# Patient Record
Sex: Female | Born: 1945 | Race: White | Hispanic: No | Marital: Single | Smoking: Former smoker
Health system: Southern US, Community
[De-identification: ages and names within clinical notes are randomized; demographics above are authoritative.]

## PROBLEM LIST (undated history)

## (undated) DIAGNOSIS — I10 Essential (primary) hypertension: Secondary | ICD-10-CM

## (undated) DIAGNOSIS — C519 Malignant neoplasm of vulva, unspecified: Secondary | ICD-10-CM

## (undated) HISTORY — PX: TUBAL LIGATION: SHX77

---

## 1998-01-26 ENCOUNTER — Emergency Department (HOSPITAL_COMMUNITY): Admission: EM | Admit: 1998-01-26 | Discharge: 1998-01-26 | Payer: Self-pay | Admitting: Emergency Medicine

## 2000-02-03 ENCOUNTER — Emergency Department (HOSPITAL_COMMUNITY): Admission: EM | Admit: 2000-02-03 | Discharge: 2000-02-03 | Payer: Self-pay | Admitting: Emergency Medicine

## 2000-07-16 ENCOUNTER — Emergency Department (HOSPITAL_COMMUNITY): Admission: EM | Admit: 2000-07-16 | Discharge: 2000-07-16 | Payer: Self-pay | Admitting: Emergency Medicine

## 2011-06-05 ENCOUNTER — Encounter: Payer: Self-pay | Admitting: Internal Medicine

## 2011-06-26 ENCOUNTER — Telehealth: Payer: Self-pay

## 2011-06-26 NOTE — Telephone Encounter (Signed)
NOS PV.  Home number has been disconnected and work reported she has just left.

## 2011-07-03 NOTE — Telephone Encounter (Signed)
Advice only

## 2011-07-10 ENCOUNTER — Other Ambulatory Visit: Payer: Self-pay | Admitting: Internal Medicine

## 2011-09-02 ENCOUNTER — Ambulatory Visit (INDEPENDENT_AMBULATORY_CARE_PROVIDER_SITE_OTHER): Payer: Managed Care, Other (non HMO)

## 2011-09-02 DIAGNOSIS — J111 Influenza due to unidentified influenza virus with other respiratory manifestations: Secondary | ICD-10-CM

## 2011-09-02 DIAGNOSIS — R05 Cough: Secondary | ICD-10-CM

## 2011-09-02 DIAGNOSIS — R059 Cough, unspecified: Secondary | ICD-10-CM

## 2011-09-02 DIAGNOSIS — R509 Fever, unspecified: Secondary | ICD-10-CM

## 2013-09-30 ENCOUNTER — Ambulatory Visit (INDEPENDENT_AMBULATORY_CARE_PROVIDER_SITE_OTHER): Payer: Managed Care, Other (non HMO) | Admitting: Emergency Medicine

## 2013-09-30 VITALS — BP 112/62 | HR 75 | Temp 97.9°F | Resp 18 | Ht 61.5 in | Wt 117.0 lb

## 2013-09-30 DIAGNOSIS — A088 Other specified intestinal infections: Secondary | ICD-10-CM

## 2013-09-30 MED ORDER — LOPERAMIDE HCL 2 MG PO TABS: ORAL_TABLET | ORAL | Status: DC

## 2013-09-30 MED ORDER — ONDANSETRON 8 MG PO TBDP: 8.0000 mg | ORAL_TABLET | Freq: Three times a day (TID) | ORAL | Status: DC | PRN

## 2013-09-30 NOTE — Progress Notes (Signed)
[facility] [address] [place] [address] [phone]  Date:  09/30/2013   Name:  Carly Schwartz   DOB:  08-23-1946   MRN:  [id number]  PCP:  No PCP Per Patient    Chief Complaint: Emesis and Diarrhea   History of Present Illness:  Carly Schwartz is a 68 y.o. very pleasant female patient who presents with the following:  Ill since Friday with nausea and vomiting, water diarrhea.  The patient has no complaint of blood, mucous, or pus in her stools. No fever or chills.  Now drinking water and holding it down mostly.  No icterus, no rash.  Ill contacts.  No improvement with over the counter medications or other home remedies. Denies other complaint or health concern today.   There are no active problems to display for this patient.   History reviewed. No pertinent past medical history.  Past Surgical History  Procedure Laterality Date  . Tubal ligation      History  Substance Use Topics  . Smoking status: Current Every Day Smoker  . Smokeless tobacco: Not on file  . Alcohol Use: Not on file    History reviewed. No pertinent family history.  No Known Allergies  Medication list has been reviewed and updated.  Current Outpatient Prescriptions on File Prior to Visit  Medication Sig Dispense Refill  . lisinopril (PRINIVIL,ZESTRIL) 2.5 MG tablet       . zolpidem (AMBIEN) 5 MG tablet        No current facility-administered medications on file prior to visit.    Review of Systems:  As per HPI, otherwise negative.    Physical Examination: Filed Vitals:   09/30/13 1621  BP: 112/62  Pulse: 75  Temp: 97.9 F (36.6 C)  Resp: 18   Filed Vitals:   09/30/13 1621  Height: 5' 1.5" (1.562 m)  Weight: 117 lb (53.071 kg)   Body mass index is 21.75 kg/(m^2). Ideal Body Weight: Weight in (lb) to have BMI = 25: 134.2  GEN: WDWN, NAD, Non-toxic, A & O x 3 HEENT: Atraumatic, Normocephalic. Neck supple. No masses, No LAD. Ears and Nose: No  external deformity. CV: RRR, No M/G/R. No JVD. No thrill. No extra heart sounds. PULM: CTA B, no wheezes, crackles, rhonchi. No retractions. No resp. distress. No accessory muscle use. ABD: S, NT, ND, +BS. No rebound. No HSM. EXTR: No c/c/e NEURO Normal gait.  PSYCH: Normally interactive. Conversant. Not depressed or anxious appearing.  Calm demeanor.    Assessment and Plan: Viral gastroenteritis zofran Imodium Clears s Signed,  Ellison Carwin, MD

## 2013-09-30 NOTE — Patient Instructions (Signed)
Viral Gastroenteritis Viral gastroenteritis is also known as stomach flu. This condition affects the stomach and intestinal tract. It can cause sudden diarrhea and vomiting. The illness typically lasts 3 to 8 days. Most people develop an immune response that eventually gets rid of the virus. While this natural response develops, the virus can make you quite ill. CAUSES  Many different viruses can cause gastroenteritis, such as rotavirus or noroviruses. You can catch one of these viruses by consuming contaminated food or water. You may also catch a virus by sharing utensils or other personal items with an infected person or by touching a contaminated surface. SYMPTOMS  The most common symptoms are diarrhea and vomiting. These problems can cause a severe loss of body fluids (dehydration) and a body salt (electrolyte) imbalance. Other symptoms may include:  Fever.  Headache.  Fatigue.  Abdominal pain. DIAGNOSIS  Your caregiver can usually diagnose viral gastroenteritis based on your symptoms and a physical exam. A stool sample may also be taken to test for the presence of viruses or other infections. TREATMENT  This illness typically goes away on its own. Treatments are aimed at rehydration. The most serious cases of viral gastroenteritis involve vomiting so severely that you are not able to keep fluids down. In these cases, fluids must be given through an intravenous line (IV). HOME CARE INSTRUCTIONS   Drink enough fluids to keep your urine clear or pale yellow. Drink small amounts of fluids frequently and increase the amounts as tolerated.  Ask your caregiver for specific rehydration instructions.  Avoid:  Foods high in sugar.  Alcohol.  Carbonated drinks.  Tobacco.  Juice.  Caffeine drinks.  Extremely hot or cold fluids.  Fatty, greasy foods.  Too much intake of anything at one time.  Dairy products until 24 to 48 hours after diarrhea stops.  You may consume probiotics.  Probiotics are active cultures of beneficial bacteria. They may lessen the amount and number of diarrheal stools in adults. Probiotics can be found in yogurt with active cultures and in supplements.  Wash your hands well to avoid spreading the virus.  Only take over-the-counter or prescription medicines for pain, discomfort, or fever as directed by your caregiver. Do not give aspirin to children. Antidiarrheal medicines are not recommended.  Ask your caregiver if you should continue to take your regular prescribed and over-the-counter medicines.  Keep all follow-up appointments as directed by your caregiver. SEEK IMMEDIATE MEDICAL CARE IF:   You are unable to keep fluids down.  You do not urinate at least once every 6 to 8 hours.  You develop shortness of breath.  You notice blood in your stool or vomit. This may look like coffee grounds.  You have abdominal pain that increases or is concentrated in one small area (localized).  You have persistent vomiting or diarrhea.  You have a fever.  The patient is a child younger than 3 months, and he or she has a fever.  The patient is a child older than 3 months, and he or she has a fever and persistent symptoms.  The patient is a child older than 3 months, and he or she has a fever and symptoms suddenly get worse.  The patient is a baby, and he or she has no tears when crying. MAKE SURE YOU:   Understand these instructions.  Will watch your condition.  Will get help right away if you are not doing well or get worse. Document Released: 08/21/2005 Document Revised: 11/13/2011 Document Reviewed: 06/07/2011   ExitCare Patient Information 2014 [employer]. Diet The clear liquid diet consists of foods that are liquid or will become liquid at room temperature. Examples of foods allowed on a clear liquid diet include fruit juice, broth or bouillon, gelatin, or frozen ice pops. You should be able to see through the liquid. The purpose of  this diet is to provide the necessary fluids, electrolytes (such as sodium and potassium), and energy to keep the body functioning during times when you are not able to consume a regular diet. A clear liquid diet should not be continued for long periods of time, as it is not nutritionally adequate.  A CLEAR LIQUID DIET MAY BE NEEDED:  When a sudden-onset (acute) condition occurs before or after surgery.   As the first step in oral feeding.   For fluid and electrolyte replacement in diarrheal diseases.   As a diet before certain medical tests are performed.  ADEQUACY The clear liquid diet is adequate only in ascorbic acid, according to the Recommended Dietary Allowances of the National Research Council.  CHOOSING FOODS Breads and Starches  Allowed: None are allowed.   Avoid: All are to be avoided.  Vegetables  Allowed: Strained vegetable juices.   Avoid: Any others.  Fruit  Allowed: Strained fruit juices and fruit drinks. Include 1 serving of citrus or vitamin C-enriched fruit juice daily.   Avoid: Any others.  Meat and Meat Substitutes  Allowed: None are allowed.   Avoid: All are to be avoided.  Milk Products  Allowed: None are allowed.   Avoid: All are to be avoided.  Soups and Combination Foods  Allowed: Clear bouillon, broth, or strained broth-based soups.   Avoid: Any others.  Desserts and Sweets  Allowed: Sugar, honey. High-protein gelatin. Flavored gelatin, ices, or frozen ice pops that do not contain milk.   Avoid: Any others.  Fats and Oils  Allowed: None are allowed.   Avoid: All are to be avoided.  Beverages  Allowed: Cereal beverages, coffee (regular or decaffeinated), tea, or soda at the discretion of your health care provider.   Avoid: Any others.  Condiments  Allowed: Salt.   Avoid: Any others, including pepper.  Supplements  Allowed: Liquid nutrition beverages that you can see  through.   Avoid: Any others that contain lactose or fiber. SAMPLE MEAL PLAN Breakfast  4 oz (120 mL) strained orange juice.   to 1 cup (120 to 240 mL) gelatin (plain or fortified).  1 cup (240 mL) beverage (coffee or tea).  Sugar, if desired. Midmorning Snack   cup (120 mL) gelatin (plain or fortified). Lunch  1 cup (240 mL) broth or consomm.  4 oz (120 mL) strained grapefruit juice.   cup (120 mL) gelatin (plain or fortified).  1 cup (240 mL) beverage (coffee or tea).  Sugar, if desired. Midafternoon Snack   cup (120 mL) fruit ice.   cup (120 mL) strained fruit juice. Dinner  1 cup (240 mL) broth or consomm.   cup (120 mL) cranberry juice.   cup (120 mL) flavored gelatin (plain or fortified).  1 cup (240 mL) beverage (coffee or tea).  Sugar, if desired. Evening Snack  4 oz (120 mL) strained apple juice (vitamin C-fortified).   cup (120 mL) flavored gelatin (plain or fortified). MAKE SURE YOU:  Understand these instructions.  Will watch your child's condition.  Will get help right away if your child is not doing well or gets worse. Document Released: 08/21/2005 Document Revised: 04/23/2013 Document Reviewed: 01/21/2013   ExitCare Patient Information 2014 [employer], [employer].  

## 2013-09-30 NOTE — Addendum Note (Signed)
Addended by: Roselee Culver on: 09/30/2013 05:35 PM   Modules accepted: Orders

## 2015-03-31 ENCOUNTER — Encounter (HOSPITAL_COMMUNITY): Payer: Self-pay | Admitting: Emergency Medicine

## 2015-03-31 ENCOUNTER — Emergency Department (HOSPITAL_COMMUNITY): Payer: Medicare Other

## 2015-03-31 ENCOUNTER — Emergency Department (HOSPITAL_COMMUNITY)
Admission: EM | Admit: 2015-03-31 | Discharge: 2015-04-01 | Discharge status: Against Medical Advice | Payer: Medicare Other | Attending: Emergency Medicine | Admitting: Emergency Medicine

## 2015-03-31 DIAGNOSIS — R6883 Chills (without fever): Secondary | ICD-10-CM | POA: Diagnosis not present

## 2015-03-31 DIAGNOSIS — D72829 Elevated white blood cell count, unspecified: Secondary | ICD-10-CM | POA: Diagnosis not present

## 2015-03-31 DIAGNOSIS — R1110 Vomiting, unspecified: Secondary | ICD-10-CM | POA: Diagnosis not present

## 2015-03-31 DIAGNOSIS — R531 Weakness: Secondary | ICD-10-CM | POA: Insufficient documentation

## 2015-03-31 DIAGNOSIS — Z720 Tobacco use: Secondary | ICD-10-CM | POA: Insufficient documentation

## 2015-03-31 DIAGNOSIS — R05 Cough: Secondary | ICD-10-CM | POA: Insufficient documentation

## 2015-03-31 DIAGNOSIS — I10 Essential (primary) hypertension: Secondary | ICD-10-CM | POA: Diagnosis not present

## 2015-03-31 DIAGNOSIS — R197 Diarrhea, unspecified: Secondary | ICD-10-CM | POA: Insufficient documentation

## 2015-03-31 HISTORY — DX: Essential (primary) hypertension: I10

## 2015-03-31 LAB — COMPREHENSIVE METABOLIC PANEL
ALBUMIN: 3.1 g/dL — AB (ref 3.5–5.0)
ALT: 14 U/L (ref 14–54)
ANION GAP: 18 — AB (ref 5–15)
AST: 23 U/L (ref 15–41)
Alkaline Phosphatase: 77 U/L (ref 38–126)
BUN: 15 mg/dL (ref 6–20)
CHLORIDE: 92 mmol/L — AB (ref 101–111)
CO2: 21 mmol/L — ABNORMAL LOW (ref 22–32)
Calcium: 9.8 mg/dL (ref 8.9–10.3)
Creatinine, Ser: 1.45 mg/dL — ABNORMAL HIGH (ref 0.44–1.00)
GFR calc non Af Amer: 36 mL/min — ABNORMAL LOW (ref >60)
GFR, EST AFRICAN AMERICAN: 42 mL/min — AB (ref >60)
Glucose, Bld: 122 mg/dL — ABNORMAL HIGH (ref 65–99)
Potassium: 3.7 mmol/L (ref 3.5–5.1)
Sodium: 131 mmol/L — ABNORMAL LOW (ref 135–145)
Total Bilirubin: 1.1 mg/dL (ref 0.3–1.2)
Total Protein: 6.7 g/dL (ref 6.5–8.1)

## 2015-03-31 LAB — CBC
HCT: 44.6 % (ref 36.0–46.0)
Hemoglobin: 14.9 g/dL (ref 12.0–15.0)
MCH: 31 pg (ref 26.0–34.0)
MCHC: 33.4 g/dL (ref 30.0–36.0)
MCV: 92.9 fL (ref 78.0–100.0)
Platelets: 329 10*3/uL (ref 150–400)
RBC: 4.8 MIL/uL (ref 3.87–5.11)
RDW: 12.6 % (ref 11.5–15.5)
WBC: 22.2 10*3/uL — ABNORMAL HIGH (ref 4.0–10.5)

## 2015-03-31 MED ORDER — SODIUM CHLORIDE 0.9 % IV SOLN
1000.0000 mL | Freq: Once | INTRAVENOUS | Status: AC
  Administered 2015-03-31: 1000 mL via INTRAVENOUS

## 2015-03-31 MED ORDER — SODIUM CHLORIDE 0.9 % IV SOLN
1000.0000 mL | INTRAVENOUS | Status: DC
Start: 2015-03-31 — End: 2015-04-01

## 2015-03-31 MED ORDER — ONDANSETRON HCL 4 MG/2ML IJ SOLN
4.0000 mg | Freq: Once | INTRAMUSCULAR | Status: AC
  Administered 2015-03-31: 4 mg via INTRAVENOUS
  Filled 2015-03-31: qty 2

## 2015-03-31 MED ORDER — ONDANSETRON 8 MG PO TBDP: 8.0000 mg | ORAL_TABLET | Freq: Three times a day (TID) | ORAL | Status: DC | PRN

## 2015-03-31 MED ORDER — IOHEXOL 300 MG/ML  SOLN
25.0000 mL | INTRAMUSCULAR | Status: AC
  Administered 2015-03-31 (×2): 25 mL via ORAL

## 2015-03-31 NOTE — Discharge Instructions (Signed)
Emergency Department Resource Guide 1) Find a Doctor and Pay Out of Pocket Although you won't have to find out who is covered by your insurance plan, it is a good idea to ask around and get recommendations. You will then need to call the office and see if the doctor you have chosen will accept you as a new patient and what types of options they offer for patients who are self-pay. Some doctors offer discounts or will set up payment plans for their patients who do not have insurance, but you will need to ask so you aren't surprised when you get to your appointment.  2) Contact Your Local Health Department Not all health departments have doctors that can see patients for sick visits, but many do, so it is worth a call to see if yours does. If you don't know where your local health department is, you can check in your phone book. The CDC also has a tool to help you locate your state's health department, and many state websites also have listings of all of their local health departments.  3) Find a [facility] If your illness is not likely to be very severe or complicated, you may want to try a walk in clinic. These are popping up all over the country in pharmacies, drugstores, and shopping centers. They're usually staffed by nurse practitioners or physician assistants that have been trained to treat common illnesses and complaints. They're usually fairly quick and inexpensive. However, if you have serious medical issues or chronic medical problems, these are probably not your best option.   Chronic Pain Problems: Organization         Address     Phone             Notes  [facility]  [phone] Patients need to be referred by their primary care doctor.   Medication Assistance: Organization         Address     Phone             Notes  [facility] Medication [facility] [address] [place], [place] [address] [phone] --Must be a resident of  [facility] -- Must have NO insurance coverage whatsoever (no Medicaid/ Medicare, etc.) -- The pt. MUST have a primary care doctor that directs their care regularly and follows them in the community   [facility]  [phone]   [employer]  [phone]    Agencies that provide inexpensive medical care: Organization         Address     Phone             Notes  [place]  [phone]   [facility]    [phone]   [facility] [place], [place] [address] [phone]   [place] [address] [place] [phone]   Planned Parenthood    [phone]   [facility]    [phone]   [place] [address] [place] [address], [place] Phone:  [phone], Fax:  [phone] Hours of Operation:  9 am - 6 pm, M-F.  Also accepts Medicaid/Medicare and self-pay.  [facility] for [place], [address], [place] Phone: [phone], Fax: [phone]. Hours of Operation:  8:30 am - 5:30 pm, M-F.  Also accepts Medicaid and self-pay.  [facility] [address], [place] Phone: [phone]   [place], [place], [place] [phone], Ext. [phone] Mondays & Thursdays: 7-9 AM.  First 15 patients are seen on a first come, first serve basis.   [facility] [address], [place] [address] [phone] Accepts Medicaid   [place] Providers:  Organization         Address     Phone             Notes  [facility] [address], [place] [phone] Also accepts self-pay patients.  [facility] [address], [place]  [phone]   [place], [address], [place] [phone]   [facility]  [address] [place] [phone]   Lucianne Lei [address], [place]   [phone] Only accepts [place] patients after they have their name applied to their card.   Self-Pay (no insurance) in [facility]:  Organization         Address     Phone             Notes  Sickle Cell Patients, [facility] [place] [phone]   [facility] [place] [phone]   [facility] [place]  [place], [place], [place] [phone]   [facility]/Dr. Osei-Bonsu  [address], [place] or [address], [place] [phone] Phone number for both [place] and [place] locations is the same.  [facility] [address] [place] [phone]   [facility] [address], [place] or [address] [phone]   [facility] [address] [place] [phone], phone; [phone], fax Sees patients 1st and 3rd Saturday of every month.  Must not qualify for public or private insurance (i.e. Medicaid, Medicare,  Health Choice, Veterans' Benefits)  Household income should be no more than 200% of the poverty level The clinic cannot treat you if you are pregnant or think you are pregnant  Sexually transmitted diseases are not treated at the clinic.

## 2015-03-31 NOTE — ED Notes (Signed)
Tomi Bamberger, MD notified that the pt does not want her CT scan.

## 2015-03-31 NOTE — ED Notes (Signed)
Dr Tomi Bamberger is at Bedside.

## 2015-03-31 NOTE — ED Notes (Signed)
Pt's states that she does not want to have her CT scan.

## 2015-03-31 NOTE — ED Notes (Signed)
Pt states that she stopped having diarrhea two days ago, and has only vomited once in the last 24 hours. Pt reports pain only when she dry heaves.

## 2015-03-31 NOTE — ED Notes (Signed)
Patient transported to X-ray 

## 2015-03-31 NOTE — ED Provider Notes (Signed)
CSN: [id number]     Arrival date & time 03/31/15  1951 History   First MD Initiated Contact with Patient 03/31/15 2136     Chief Complaint  Patient presents with  . Emesis  . Diarrhea    Patient is a 69 y.o. female presenting with vomiting and diarrhea. The history is provided by the patient.  Emesis Severity:  Moderate Duration:  1 week Timing:  Constant Emesis appearance: dry heaves now. Able to tolerate:  Liquids Relieved by:  Nothing Exacerbated by: trying to eat. Associated symptoms: chills and diarrhea   Associated symptoms: no abdominal pain   Diarrhea Severity:  Moderate Onset quality:  Gradual Number of episodes:  None today Timing:  Intermittent Progression:  Resolved Associated symptoms: chills and vomiting   Associated symptoms: no abdominal pain and no fever     Past Medical History  Diagnosis Date  . Hypertension    Past Surgical History  Procedure Laterality Date  . Tubal ligation     No family history on file. History  Substance Use Topics  . Smoking status: Current Every Day Smoker  . Smokeless tobacco: Not on file  . Alcohol Use: No   OB History    No data available     Review of Systems  Constitutional: Positive for chills. Negative for fever.  Respiratory: Positive for cough.   Gastrointestinal: Positive for vomiting and diarrhea. Negative for abdominal pain.  Neurological: Positive for weakness.  All other systems reviewed and are negative.     Allergies  Review of patient's allergies indicates no known allergies.  Home Medications   Prior to Admission medications   Medication Sig Start Date End Date Taking? Authorizing Provider  lisinopril (PRINIVIL,ZESTRIL) 2.5 MG tablet  06/01/11   Historical Provider, MD  loperamide (IMODIUM A-D) 2 MG tablet 2 now and one hourly prn loose stool  Max 8 in 24 hours 09/30/13   Roselee Culver, MD  ondansetron (ZOFRAN-ODT) 8 MG disintegrating tablet Take 1 tablet (8 mg total) by mouth every 8  (eight) hours as needed for nausea. 03/31/15   Dorie Rank, MD  zolpidem (AMBIEN) 5 MG tablet  06/01/11   Historical Provider, MD   BP 127/72 mmHg  Pulse 109  Resp 12  Ht 5\' 2"  (1.575 m)  Wt 113 lb (51.256 kg)  BMI 20.66 kg/m2  SpO2 96% Physical Exam  Constitutional: She appears well-developed and well-nourished. No distress.  HENT:  Head: Normocephalic and atraumatic.  Right Ear: External ear normal.  Left Ear: External ear normal.  Eyes: Conjunctivae are normal. Right eye exhibits no discharge. Left eye exhibits no discharge. No scleral icterus.  Neck: Neck supple. No tracheal deviation present.  Cardiovascular: Normal rate, regular rhythm and intact distal pulses.   Pulmonary/Chest: Effort normal and breath sounds normal. No stridor. No respiratory distress. She has no wheezes. She has no rales.  Abdominal: Soft. Bowel sounds are normal. She exhibits no distension. There is no tenderness. There is no rebound and no guarding.  Musculoskeletal: She exhibits no edema or tenderness.  Neurological: She is alert. She has normal strength. No cranial nerve deficit (no facial droop, extraocular movements intact, no slurred speech) or sensory deficit. She exhibits normal muscle tone. She displays no seizure activity. Coordination normal.  Skin: Skin is warm and dry. No rash noted.  Psychiatric: She has a normal mood and affect.  Nursing note and vitals reviewed.   ED Course  Procedures (including critical care time) Labs Review Labs Reviewed  COMPREHENSIVE METABOLIC PANEL - Abnormal; Notable for the following:    Sodium 131 (*)    Chloride 92 (*)    CO2 21 (*)    Glucose, Bld 122 (*)    Creatinine, Ser 1.45 (*)    Albumin 3.1 (*)    GFR calc non Af Amer 36 (*)    GFR calc Af Amer 42 (*)    Anion gap 18 (*)    All other components within normal limits  CBC - Abnormal; Notable for the following:    WBC 22.2 (*)    All other components within normal limits  URINALYSIS, ROUTINE W  REFLEX MICROSCOPIC (NOT AT [facility])    Imaging Review Dg Chest 2 View  03/31/2015   CLINICAL DATA:  Nausea, diarrhea, leukocytosis.  EXAM: CHEST  2 VIEW  COMPARISON:  None  FINDINGS: There is a suggestion of upper lobe nodularity, right greater than left, superimposed on more generalized interstitial coarsening. There is probably also is moderate upper lobe emphysematous change. Hilar and mediastinal contours are unremarkable. There are no pleural effusions. Heart size is normal.  IMPRESSION: Question nodularity in the upper lobes, superimposed on emphysematous changes and interstitial coarsening. Consider chest CT for optimal characterization.   Electronically Signed   By: Andreas Newport M.D.   On: 03/31/2015 23:00      MDM   Final diagnoses:  Diarrhea  Elevated WBC count    Discussed doing a CT considering her elevated WBC count.  I explained to the patient this could be a sign of a serious infection that could be very dangerous.  Pt states she feels fine.  She does not want to wait for a CT scan.  SHe just wants to have something for nausea.  She understands she can return to the ED at any time if she changes her mind.  Discussed outpatient follow up with a PCP    Dorie Rank, MD 03/31/15 2332

## 2015-03-31 NOTE — ED Notes (Signed)
Pt. reports intermittent emesis and diarrhea onset last week with generalized weakness/fatigue . Denies fever , chills or body aches.

## 2015-04-01 ENCOUNTER — Ambulatory Visit (HOSPITAL_COMMUNITY): Payer: Medicare Other

## 2015-04-17 ENCOUNTER — Encounter (HOSPITAL_COMMUNITY): Payer: Self-pay | Admitting: Emergency Medicine

## 2015-04-17 DIAGNOSIS — A419 Sepsis, unspecified organism: Principal | ICD-10-CM | POA: Diagnosis present

## 2015-04-17 DIAGNOSIS — I10 Essential (primary) hypertension: Secondary | ICD-10-CM | POA: Diagnosis present

## 2015-04-17 DIAGNOSIS — R918 Other nonspecific abnormal finding of lung field: Secondary | ICD-10-CM | POA: Diagnosis present

## 2015-04-17 DIAGNOSIS — J90 Pleural effusion, not elsewhere classified: Secondary | ICD-10-CM | POA: Diagnosis present

## 2015-04-17 DIAGNOSIS — E162 Hypoglycemia, unspecified: Secondary | ICD-10-CM | POA: Diagnosis not present

## 2015-04-17 DIAGNOSIS — I951 Orthostatic hypotension: Secondary | ICD-10-CM | POA: Diagnosis present

## 2015-04-17 DIAGNOSIS — E860 Dehydration: Secondary | ICD-10-CM | POA: Diagnosis present

## 2015-04-17 DIAGNOSIS — E876 Hypokalemia: Secondary | ICD-10-CM | POA: Diagnosis not present

## 2015-04-17 DIAGNOSIS — N390 Urinary tract infection, site not specified: Secondary | ICD-10-CM | POA: Diagnosis present

## 2015-04-17 DIAGNOSIS — F17210 Nicotine dependence, cigarettes, uncomplicated: Secondary | ICD-10-CM | POA: Diagnosis present

## 2015-04-17 DIAGNOSIS — R6520 Severe sepsis without septic shock: Secondary | ICD-10-CM | POA: Diagnosis present

## 2015-04-17 DIAGNOSIS — E872 Acidosis: Secondary | ICD-10-CM | POA: Diagnosis present

## 2015-04-17 NOTE — ED Notes (Signed)
Patient here with complaint of 1 week of increasing dizziness, nausea, and weakness. Also reports that her right ear is painful and she feels pain near the glands of her neck on that side. Denies history of vertigo. Describes incidents of falling when she was suddenly overcome with dizziness. Denies emesis, but states dry heaves. Only complaining of mid abd pain secondary to attempting to vomit.

## 2015-04-18 ENCOUNTER — Emergency Department (HOSPITAL_COMMUNITY): Payer: Medicare Other

## 2015-04-18 ENCOUNTER — Encounter (HOSPITAL_COMMUNITY): Payer: Self-pay | Admitting: Internal Medicine

## 2015-04-18 ENCOUNTER — Inpatient Hospital Stay (HOSPITAL_COMMUNITY)
Admission: EM | Admit: 2015-04-18 | Discharge: 2015-04-20 | DRG: 872 | Disposition: A | Discharge status: Home / Self Care | Payer: Medicare Other | Attending: Student in an Organized Health Care Education/Training Program | Admitting: Student in an Organized Health Care Education/Training Program

## 2015-04-18 DIAGNOSIS — R6520 Severe sepsis without septic shock: Secondary | ICD-10-CM | POA: Diagnosis present

## 2015-04-18 DIAGNOSIS — R911 Solitary pulmonary nodule: Secondary | ICD-10-CM | POA: Diagnosis not present

## 2015-04-18 DIAGNOSIS — E876 Hypokalemia: Secondary | ICD-10-CM

## 2015-04-18 DIAGNOSIS — E872 Acidosis, unspecified: Secondary | ICD-10-CM | POA: Diagnosis present

## 2015-04-18 DIAGNOSIS — A419 Sepsis, unspecified organism: Secondary | ICD-10-CM | POA: Diagnosis present

## 2015-04-18 DIAGNOSIS — B9689 Other specified bacterial agents as the cause of diseases classified elsewhere: Secondary | ICD-10-CM | POA: Diagnosis not present

## 2015-04-18 DIAGNOSIS — N289 Disorder of kidney and ureter, unspecified: Secondary | ICD-10-CM

## 2015-04-18 DIAGNOSIS — E162 Hypoglycemia, unspecified: Secondary | ICD-10-CM | POA: Diagnosis not present

## 2015-04-18 DIAGNOSIS — R55 Syncope and collapse: Secondary | ICD-10-CM | POA: Diagnosis present

## 2015-04-18 DIAGNOSIS — I951 Orthostatic hypotension: Secondary | ICD-10-CM | POA: Diagnosis present

## 2015-04-18 DIAGNOSIS — J90 Pleural effusion, not elsewhere classified: Secondary | ICD-10-CM | POA: Diagnosis present

## 2015-04-18 DIAGNOSIS — E860 Dehydration: Secondary | ICD-10-CM

## 2015-04-18 DIAGNOSIS — I10 Essential (primary) hypertension: Secondary | ICD-10-CM | POA: Diagnosis present

## 2015-04-18 DIAGNOSIS — R7989 Other specified abnormal findings of blood chemistry: Secondary | ICD-10-CM

## 2015-04-18 DIAGNOSIS — F17210 Nicotine dependence, cigarettes, uncomplicated: Secondary | ICD-10-CM | POA: Diagnosis present

## 2015-04-18 DIAGNOSIS — N390 Urinary tract infection, site not specified: Secondary | ICD-10-CM | POA: Diagnosis present

## 2015-04-18 DIAGNOSIS — B3749 Other urogenital candidiasis: Secondary | ICD-10-CM | POA: Diagnosis present

## 2015-04-18 DIAGNOSIS — R918 Other nonspecific abnormal finding of lung field: Secondary | ICD-10-CM

## 2015-04-18 DIAGNOSIS — D72829 Elevated white blood cell count, unspecified: Secondary | ICD-10-CM

## 2015-04-18 DIAGNOSIS — R000 Tachycardia, unspecified: Secondary | ICD-10-CM

## 2015-04-18 DIAGNOSIS — R319 Hematuria, unspecified: Secondary | ICD-10-CM

## 2015-04-18 DIAGNOSIS — F17200 Nicotine dependence, unspecified, uncomplicated: Secondary | ICD-10-CM

## 2015-04-18 LAB — URINE MICROSCOPIC-ADD ON

## 2015-04-18 LAB — COMPREHENSIVE METABOLIC PANEL
ALBUMIN: 2.7 g/dL — AB (ref 3.5–5.0)
ALT: 9 U/L — AB (ref 14–54)
ANION GAP: 20 — AB (ref 5–15)
AST: 21 U/L (ref 15–41)
Alkaline Phosphatase: 75 U/L (ref 38–126)
BUN: 9 mg/dL (ref 6–20)
CHLORIDE: 99 mmol/L — AB (ref 101–111)
CO2: 18 mmol/L — ABNORMAL LOW (ref 22–32)
Calcium: 9.5 mg/dL (ref 8.9–10.3)
Creatinine, Ser: 1.18 mg/dL — ABNORMAL HIGH (ref 0.44–1.00)
GFR calc Af Amer: 53 mL/min — ABNORMAL LOW (ref >60)
GFR, EST NON AFRICAN AMERICAN: 46 mL/min — AB (ref >60)
GLUCOSE: 132 mg/dL — AB (ref 65–99)
POTASSIUM: 3.5 mmol/L (ref 3.5–5.1)
Sodium: 137 mmol/L (ref 135–145)
TOTAL PROTEIN: 5.8 g/dL — AB (ref 6.5–8.1)
Total Bilirubin: 1.2 mg/dL (ref 0.3–1.2)

## 2015-04-18 LAB — BASIC METABOLIC PANEL
ANION GAP: 11 (ref 5–15)
BUN: 8 mg/dL (ref 6–20)
CALCIUM: 7.8 mg/dL — AB (ref 8.9–10.3)
CO2: 19 mmol/L — AB (ref 22–32)
Chloride: 108 mmol/L (ref 101–111)
Creatinine, Ser: 0.91 mg/dL (ref 0.44–1.00)
Glucose, Bld: 77 mg/dL (ref 65–99)
POTASSIUM: 3.1 mmol/L — AB (ref 3.5–5.1)
SODIUM: 138 mmol/L (ref 135–145)

## 2015-04-18 LAB — URINALYSIS, ROUTINE W REFLEX MICROSCOPIC
Glucose, UA: NEGATIVE mg/dL
Glucose, UA: NEGATIVE mg/dL
Ketones, ur: >80 mg/dL — AB
Ketones, ur: 40 mg/dL — AB
NITRITE: POSITIVE — AB
NITRITE: POSITIVE — AB
PH: 6 (ref 5.0–8.0)
PROTEIN: 100 mg/dL — AB
PROTEIN: 30 mg/dL — AB
SPECIFIC GRAVITY, URINE: 1.018 (ref 1.005–1.030)
Specific Gravity, Urine: 1.02 (ref 1.005–1.030)
UROBILINOGEN UA: 1 mg/dL (ref 0.0–1.0)
UROBILINOGEN UA: 1 mg/dL (ref 0.0–1.0)
pH: 6 (ref 5.0–8.0)

## 2015-04-18 LAB — DIFFERENTIAL
Basophils Absolute: 0 10*3/uL (ref 0.0–0.1)
Basophils Relative: 0 % (ref 0–1)
EOS ABS: 0.1 10*3/uL (ref 0.0–0.7)
EOS PCT: 1 % (ref 0–5)
Lymphocytes Relative: 8 % — ABNORMAL LOW (ref 12–46)
Lymphs Abs: 1.3 10*3/uL (ref 0.7–4.0)
Monocytes Absolute: 1 10*3/uL (ref 0.1–1.0)
Monocytes Relative: 5 % (ref 3–12)
NEUTROS ABS: 15.3 10*3/uL — AB (ref 1.7–7.7)
NEUTROS PCT: 86 % — AB (ref 43–77)

## 2015-04-18 LAB — CBC
HCT: 40.9 % (ref 36.0–46.0)
HEMOGLOBIN: 13.8 g/dL (ref 12.0–15.0)
MCH: 31 pg (ref 26.0–34.0)
MCHC: 33.7 g/dL (ref 30.0–36.0)
MCV: 91.9 fL (ref 78.0–100.0)
Platelets: 289 10*3/uL (ref 150–400)
RBC: 4.45 MIL/uL (ref 3.87–5.11)
RDW: 13.8 % (ref 11.5–15.5)
WBC: 18 10*3/uL — ABNORMAL HIGH (ref 4.0–10.5)

## 2015-04-18 LAB — LIPASE, BLOOD: LIPASE: 16 U/L — AB (ref 22–51)

## 2015-04-18 LAB — I-STAT CG4 LACTIC ACID, ED
Lactic Acid, Venous: 6.21 mmol/L (ref 0.5–2.0)
Lactic Acid, Venous: 0.56 mmol/L (ref 0.5–2.0)

## 2015-04-18 MED ORDER — ONDANSETRON HCL 4 MG/2ML IJ SOLN
4.0000 mg | Freq: Once | INTRAMUSCULAR | Status: AC
  Administered 2015-04-18: 4 mg via INTRAVENOUS
  Filled 2015-04-18: qty 2

## 2015-04-18 MED ORDER — ENOXAPARIN SODIUM 40 MG/0.4ML ~~LOC~~ SOLN
40.0000 mg | SUBCUTANEOUS | Status: DC
  Administered 2015-04-18 – 2015-04-20 (×3): 40 mg via SUBCUTANEOUS
  Filled 2015-04-18 (×2): qty 0.4

## 2015-04-18 MED ORDER — ACETAMINOPHEN 650 MG RE SUPP: 650.0000 mg | Freq: Four times a day (QID) | RECTAL | Status: DC | PRN

## 2015-04-18 MED ORDER — SODIUM CHLORIDE 0.9 % IV SOLN
1000.0000 mL | Freq: Once | INTRAVENOUS | Status: AC
  Administered 2015-04-18: 1000 mL via INTRAVENOUS

## 2015-04-18 MED ORDER — ONDANSETRON HCL 4 MG PO TABS
4.0000 mg | ORAL_TABLET | Freq: Four times a day (QID) | ORAL | Status: DC | PRN
  Administered 2015-04-20: 4 mg via ORAL
  Filled 2015-04-18: qty 1

## 2015-04-18 MED ORDER — SODIUM CHLORIDE 0.9 % IV SOLN
1000.0000 mL | INTRAVENOUS | Status: DC
  Administered 2015-04-18 – 2015-04-20 (×6): 1000 mL via INTRAVENOUS

## 2015-04-18 MED ORDER — DEXTROSE 5 % IV SOLN
1.0000 g | Freq: Once | INTRAVENOUS | Status: AC
  Administered 2015-04-18: 1 g via INTRAVENOUS
  Filled 2015-04-18: qty 10

## 2015-04-18 MED ORDER — RAMELTEON 8 MG PO TABS
4.0000 mg | ORAL_TABLET | Freq: Once | ORAL | Status: AC
  Administered 2015-04-18: 4 mg via ORAL
  Filled 2015-04-18: qty 1

## 2015-04-18 MED ORDER — SODIUM CHLORIDE 0.9 % IV BOLUS (SEPSIS)
1000.0000 mL | Freq: Once | INTRAVENOUS | Status: AC
  Administered 2015-04-18: 1000 mL via INTRAVENOUS

## 2015-04-18 MED ORDER — ACETAMINOPHEN 325 MG PO TABS: 650.0000 mg | ORAL_TABLET | Freq: Four times a day (QID) | ORAL | Status: DC | PRN

## 2015-04-18 MED ORDER — SODIUM CHLORIDE 0.9 % IJ SOLN
3.0000 mL | Freq: Two times a day (BID) | INTRAMUSCULAR | Status: DC
  Administered 2015-04-18 – 2015-04-19 (×3): 3 mL via INTRAVENOUS

## 2015-04-18 MED ORDER — ONDANSETRON HCL 4 MG/2ML IJ SOLN
4.0000 mg | Freq: Four times a day (QID) | INTRAMUSCULAR | Status: DC | PRN
  Administered 2015-04-19: 4 mg via INTRAVENOUS
  Filled 2015-04-18: qty 2

## 2015-04-18 NOTE — H&P (Signed)
Date: 04/18/2015               Patient Name:  Carly Schwartz MRN: [id number]  DOB: 1946-01-23 Age / Sex: 69 y.o., female   PCP: No Pcp Per Patient         Medical Service: Internal Medicine Teaching Service         Attending Physician: Dr. Annia Belt, MD    First Contact: Dr. Zada Finders Pager: [phone]  Second Contact: Dr. Joni Reining Pager: [phone]       After Hours (After 5p/  First Contact Pager: [phone]  weekends / holidays): Second Contact Pager: [phone]   Chief Complaint: Dizziness  History of Present Illness: [name] is a 69 year old lady with PMH of HTN who presents with complaints of 1 week history of occasional dizziness/lightheadedness, nausea, and right ear pain. She states her dizziness occurs when she stands up from sitting or laying position and describes it more as a lightheaded feeling without a sensation that the room is spinning. She states that she has not vomited, but has had dry heaves. She does endorse a new non-productive cough that started yesterday. Of note, she came to the ED 2.5 weeks ago with symptoms of diarrhea and vomiting. Her WBC was 22 at the time and the ED provider counseled her on evaluation for serious infection, but she stated that she felt fine and only wanted medication for her nausea. Afterwards, her symptoms resolved until 1 week ago. She denies any dysuria, urgency, or frequency however UA in the ED shows WBCs/RBCs too numerous to count, positive nitrite, large leukocytes, and many squamous epithelial cells. Her lactic acid was elevated at 6.21, she was given 2L IV NS and lactic acid came down to 0.56. She was started on Ceftriaxone 1 gram IV in the ED. She denies any discharge or vaginal symptoms and states that she has not been sexually active for some time. She is post-menopausal and denies any bleeding.  She states that she has fallen twice in the last month or two, without any prodrome or loss of consciousness. She  denies any injury to herself other than some scrapes to her knee. Her blood pressure has been between 90-100s/50-60s. She does smoke 0.5 PPD since she was 69 years old and does endorse some recent SOB when climbing stairs.  Vitals on arrival to ED: BP 104/61, P-147, RR-18, Temp 97.4, SpO2 97% on room air.  Meds: Current Facility-Administered Medications  Medication Dose Route Frequency Provider Last Rate Last Dose  . 0.9 %  sodium chloride infusion  1,000 mL Intravenous Continuous Delora Fuel, MD 580 mL/hr at 04/18/15 0911 1,000 mL at 04/18/15 0911  . acetaminophen (TYLENOL) tablet 650 mg  650 mg Oral Q6H PRN Lucious Groves, DO       Or  . acetaminophen (TYLENOL) suppository 650 mg  650 mg Rectal Q6H PRN Lucious Groves, DO      . enoxaparin (LOVENOX) injection 40 mg  40 mg Subcutaneous Q24H Lucious Groves, DO      . ondansetron [facility]) tablet 4 mg  4 mg Oral Q6H PRN Lucious Groves, DO       Or  . ondansetron [facility]) injection 4 mg  4 mg Intravenous Q6H PRN Lucious Groves, DO      . sodium chloride 0.9 % injection 3 mL  3 mL Intravenous Q12H Lucious Groves, DO   3 mL at 04/18/15 1330  Allergies: Allergies as of 04/17/2015  . (No Known Allergies)   Past Medical History  Diagnosis Date  . Hypertension    Past Surgical History  Procedure Laterality Date  . Tubal ligation     History reviewed. No pertinent family history. Social History   Social History  . Marital Status: Single    Spouse Name: N/A  . Number of Children: N/A  . Years of Education: N/A   Occupational History  . Not on file.   Social History Main Topics  . Smoking status: Current Every Day Smoker  . Smokeless tobacco: Not on file  . Alcohol Use: No  . Drug Use: No  . Sexual Activity: Not on file   Other Topics Concern  . Not on file   Social History Narrative    Review of Systems: Review of Systems  Constitutional: Positive for chills. Negative for fever.  HENT: Positive for ear pain.   Eyes:  Negative for blurred vision and double vision.  Respiratory: Positive for cough. Negative for sputum production, shortness of breath and wheezing.   Cardiovascular: Negative for chest pain.  Gastrointestinal: Positive for nausea. Negative for vomiting, abdominal pain, diarrhea and constipation.  Genitourinary: Negative for dysuria, urgency, frequency and hematuria.       Denies discharge, no incontinence  Musculoskeletal: Positive for falls. Negative for myalgias and joint pain.  Skin: Negative for rash.  Neurological: Positive for dizziness and weakness. Negative for headaches.     Physical Exam: Blood pressure 109/71, pulse 112, temperature 98 F (36.7 C), temperature source Oral, resp. rate 18, height 5\' 2"  (1.575 m), weight 114 lb (51.71 kg), SpO2 96 %. Physical Exam  General: resting in bed, no acute distress HEENT: PERRL, EOMI, no scleral icterus, bilateral ears without drainage or obvious sign of infection, TMs normal bilaterally, oral mucosa moist without exudates or sign of abscess/infection, wears dentures Cardiac: regular rhythm, tachycardic, no rubs, murmurs or gallops Pulm: bibasilar crackles heard bilaterally Abd: soft, nontender, nondistended, BS present, no flank pain/tenderness Ext: warm and well perfused, no pedal edema, healing scabs on left knee Neuro: alert and oriented X3, normal mood and behavior   Lab results: Basic Metabolic Panel:  Recent Labs  04/17/15 2339 04/18/15 0630  NA 137 138  K 3.5 3.1*  CL 99* 108  CO2 18* 19*  GLUCOSE 132* 77  BUN 9 8  CREATININE 1.18* 0.91  CALCIUM 9.5 7.8*   Liver Function Tests:  Recent Labs  04/17/15 2339  AST 21  ALT 9*  ALKPHOS 75  BILITOT 1.2  PROT 5.8*  ALBUMIN 2.7*    Recent Labs  04/17/15 2339  LIPASE 16*   No results for input(s): AMMONIA in the last 72 hours. CBC:  Recent Labs  04/17/15 2339  WBC 18.0*  NEUTROABS 15.3*  HGB 13.8  HCT 40.9  MCV 91.9  PLT 289   Urinalysis:  Recent  Labs  04/18/15 0737  COLORURINE AMBER*  LABSPEC 1.020  PHURINE 6.0  GLUCOSEU NEGATIVE  HGBUR LARGE*  BILIRUBINUR SMALL*  KETONESUR >80*  PROTEINUR 100*  UROBILINOGEN 1.0  NITRITE POSITIVE*  LEUKOCYTESUR LARGE*    Imaging results:  Dg Chest Port 1 View  04/18/2015   CLINICAL DATA:  Leukocytosis.  EXAM: PORTABLE CHEST - 1 VIEW  COMPARISON:  March 31, 2015.  FINDINGS: The heart size and mediastinal contours are within normal limits. No pneumothorax or pleural effusion is noted. Small dense nodular densities are noted in right upper lobe most consistent with granulomata.  Rounded nodular density is noted in left upper lobe. The visualized skeletal structures are unremarkable.  IMPRESSION: Rounded nodular density seen in left upper lobe ; CT scan of the chest is recommended to evaluate for possible neoplasm.   Electronically Signed   By: Marijo Conception, M.D.   On: 04/18/2015 10:02    Other results: EKG: sinus tachycardia, nonspecific t-wave abnormality, there are no previous tracings available for comparison.  Assessment & Plan by Problem: Active Problems:   UTI (lower urinary tract infection)   Orthostatic hypotension   Lactic acidosis   Near syncope   Solitary pulmonary nodule  UTI (lower urinary tract infection): Although patient is asymptomatic, UA shows WBCs/RBCs too numerous to count, positive nitrite, large leukocytes, and many squamous epithelial cells. Possibly due to unclean catch. UTI in elderly can present atypically and this may be the case here. She denies discharge, but have to consider other infectious process such as PID. Blood cultures were not obtained as antibiotics were already started. She was given Rocephin 1 gram IV in the ED. WBC today is 18.0 compared to 22.2 two weeks ago. -CBC, BMP in the morning -Repeat UA w/ reflex microscopy -Urine culture pending    Solitary pulmonary nodule: Portable CXR was ordered due to possible concern for pneumonia due to  patient's history of SOB, leukocytosis, and recent non-productive cough. Bibasilar crackles are heard on physical exam. CXR results show small dense nodular densities in right upper lobe consistent with granulomata as well as a rounded nodular density in the left upper lobe. Patient has 0.5 PPD smoking history for 40+ years. Will need to follow up with repeat 2 view CXR or CT chest to evaluate for possible neoplasm. -BMP tomorrow morning for renal function -Will need to consider repeat 2 view CXR vs CT chest tomorrow  Orthostatic hypotension: Patients symptoms of lightheadedness when standing from sitting or lying position without other associated symptoms are suggestive of orthostatic hypotension. In the ED, orthostatics were checked and she did have significant drop in both systolic and diastolic pressures.  Lying BP - 93/54, pulse 118 Sitting BP - 78/62, pulse 126 Standing BP - 66/39, pulse 139 After fluid resuscitation, her orthostatics normalized. -Continue IV fluids NS 125 mL/hr  Lactic acidosis: Lactic acid of 6.21 resolved after 2 L IV NS at 0.56 at last check. Likely 2/2 to infectious process: -Continue IV fluids NS  Near syncope: Her near syncopal episodes are likely 2/2 orthostatic hypotension. She does not describe any pre-syncopal symptoms, no seizure-like activity or post-ictal symptoms, no loss of consciousness, and no vertigo. No heart murmur or irregular rhythm appreciated on cardiac exam. -Continue IV fluids NS  Diet: regular  DVT ppx: Lovenox 40 mg daily   Dispo: Disposition is deferred at this time, awaiting improvement of current medical problems. Anticipated discharge in approximately 1-3 day(s).   The patient does not have a current PCP (No Pcp Per Patient) and does not need an [facility] hospital follow-up appointment after discharge.  The patient does not have transportation limitations that hinder transportation to clinic appointments.  Signed: Zada Finders,  MD 04/18/2015, 2:35 PM

## 2015-04-18 NOTE — ED Provider Notes (Signed)
This is a 69 year old female signed out to me by Dr. Roxanne Mins who presents last night with nausea, vomiting, and weakness. She has had some GI syndrome for the past month that included nausea vomiting and diarrhea. The diarrhea has cleared and she felt better for some period of time. However, over the past week she has had ongoing nausea and vomiting. She has noted some episodes of chills and did not take her temperature. She denies any urinary tract infection symptoms such as increased frequency of urination, pain with urination, or decreased urine output. His sensation last night her initial heart rate was 148 and blood pressure was 104/61. She subsequently had an elevated lactic acid at 6.21. She was to 2 L of normal saline and lactic acid cleared. Source of infection appears to be urine with too numerous to count white blood cells and too numerous to count white blood cells, nitrite positive. She is receiving Rocephin IV here. She feels improved. Heart rate has decreased to 100 after 2 L normal saline. Last blood pressure was in the room approximately 10 minutes ago with systolic of 882. I discussed the patient's care with the teaching service as she is an unassigned medical patients. She'll be admitted to a telemetry bed.  Pattricia Boss, MD 04/18/15 [phone]

## 2015-04-18 NOTE — ED Notes (Signed)
MD at bedside. 

## 2015-04-18 NOTE — ED Provider Notes (Signed)
CSN: [id number]     Arrival date & time 04/17/15  2307 History  This chart was scribed for Delora Fuel, MD by Ludger Nutting, ED Scribe. This patient was seen in room A10C/A10C and the patient's care was started 2:27 AM.    Chief Complaint  Patient presents with  . Dizziness  . Fall   The history is provided by the patient. No language interpreter was used.     HPI Comments: Carly Schwartz is a 69 y.o. female who presents to the Emergency Department complaining of 1 week of intermittent, unchanged dizziness. She also complains of nausea, right ear pain, and generalized weakness. She states the dizziness occurs upon standing and is describes by room spinning and feeling drunk. She reports episodes of falls from the dizziness, states she repeatedly falls towards the left side. She reports having similar symptoms 3 weeks ago which resolved until 1 week ago. She denies fever, chills, diaphoresis.    Past Medical History  Diagnosis Date  . Hypertension    Past Surgical History  Procedure Laterality Date  . Tubal ligation     History reviewed. No pertinent family history. Social History  Substance Use Topics  . Smoking status: Current Every Day Smoker  . Smokeless tobacco: None  . Alcohol Use: No   OB History    No data available     Review of Systems  Constitutional: Negative for fever, chills and diaphoresis.  HENT: Positive for ear pain (right).   Gastrointestinal: Positive for nausea. Negative for vomiting.  Neurological: Positive for dizziness, weakness ( generalized) and light-headedness.  All other systems reviewed and are negative.     Allergies  Review of patient's allergies indicates no known allergies.  Home Medications   Prior to Admission medications   Medication Sig Start Date End Date Taking? Authorizing Provider  lisinopril (PRINIVIL,ZESTRIL) 2.5 MG tablet  06/01/11   Historical Provider, MD  loperamide (IMODIUM A-D) 2 MG tablet 2 now and one hourly prn loose  stool  Max 8 in 24 hours 09/30/13   Roselee Culver, MD  ondansetron (ZOFRAN-ODT) 8 MG disintegrating tablet Take 1 tablet (8 mg total) by mouth every 8 (eight) hours as needed for nausea. 03/31/15   Dorie Rank, MD  zolpidem (AMBIEN) 5 MG tablet  06/01/11   Historical Provider, MD   BP 104/61 mmHg  Pulse 147  Temp(Src) 97.4 F (36.3 C) (Oral)  Resp 18  Ht 5\' 2"  (1.575 m)  Wt 114 lb (51.71 kg)  BMI 20.85 kg/m2  SpO2 97% Physical Exam  Constitutional: She is oriented to person, place, and time. She appears well-developed and well-nourished.  Frail appearing   HENT:  Head: Normocephalic and atraumatic.  Cardiovascular: Regular rhythm and normal heart sounds.  Tachycardia present.   Pulmonary/Chest: Effort normal and breath sounds normal.  Abdominal: Soft. She exhibits no distension. Bowel sounds are decreased.  Bowel sounds are decreased   Neurological: She is alert and oriented to person, place, and time.  Normal finger to nose testing. Romberg test generally unsteady but does not fall.   Skin: Skin is warm and dry.  Psychiatric: She has a normal mood and affect.  Nursing note and vitals reviewed.   ED Course  Procedures (including critical care time)  DIAGNOSTIC STUDIES: Oxygen Saturation is 97% on RA, adequate by my interpretation.    COORDINATION OF CARE: 2:27 AM Discussed treatment plan with pt at bedside and pt agreed to plan.   Labs Review Results for orders  placed or performed during the hospital encounter of 04/18/15  Lipase, blood  Result Value Ref Range   Lipase 16 (L) 22 - 51 U/L  Comprehensive metabolic panel  Result Value Ref Range   Sodium 137 135 - 145 mmol/L   Potassium 3.5 3.5 - 5.1 mmol/L   Chloride 99 (L) 101 - 111 mmol/L   CO2 18 (L) 22 - 32 mmol/L   Glucose, Bld 132 (H) 65 - 99 mg/dL   BUN 9 6 - 20 mg/dL   Creatinine, Ser 1.18 (H) 0.44 - 1.00 mg/dL   Calcium 9.5 8.9 - 10.3 mg/dL   Total Protein 5.8 (L) 6.5 - 8.1 g/dL   Albumin 2.7 (L) 3.5 - 5.0  g/dL   AST 21 15 - 41 U/L   ALT 9 (L) 14 - 54 U/L   Alkaline Phosphatase 75 38 - 126 U/L   Total Bilirubin 1.2 0.3 - 1.2 mg/dL   GFR calc non Af Amer 46 (L) >60 mL/min   GFR calc Af Amer 53 (L) >60 mL/min   Anion gap 20 (H) 5 - 15  CBC  Result Value Ref Range   WBC 18.0 (H) 4.0 - 10.5 K/uL   RBC 4.45 3.87 - 5.11 MIL/uL   Hemoglobin 13.8 12.0 - 15.0 g/dL   HCT 40.9 36.0 - 46.0 %   MCV 91.9 78.0 - 100.0 fL   MCH 31.0 26.0 - 34.0 pg   MCHC 33.7 30.0 - 36.0 g/dL   RDW 13.8 11.5 - 15.5 %   Platelets 289 150 - 400 K/uL  Urinalysis, Routine w reflex microscopic (not at [facility])  Result Value Ref Range   Color, Urine AMBER (A) YELLOW   APPearance CLOUDY (A) CLEAR   Specific Gravity, Urine 1.020 1.005 - 1.030   pH 6.0 5.0 - 8.0   Glucose, UA NEGATIVE NEGATIVE mg/dL   Hgb urine dipstick LARGE (A) NEGATIVE   Bilirubin Urine SMALL (A) NEGATIVE   Ketones, ur >80 (A) NEGATIVE mg/dL   Protein, ur 100 (A) NEGATIVE mg/dL   Urobilinogen, UA 1.0 0.0 - 1.0 mg/dL   Nitrite POSITIVE (A) NEGATIVE   Leukocytes, UA LARGE (A) NEGATIVE  Differential  Result Value Ref Range   Neutrophils Relative % 86 (H) 43 - 77 %   Neutro Abs 15.3 (H) 1.7 - 7.7 K/uL   Lymphocytes Relative 8 (L) 12 - 46 %   Lymphs Abs 1.3 0.7 - 4.0 K/uL   Monocytes Relative 5 3 - 12 %   Monocytes Absolute 1.0 0.1 - 1.0 K/uL   Eosinophils Relative 1 0 - 5 %   Eosinophils Absolute 0.1 0.0 - 0.7 K/uL   Basophils Relative 0 0 - 1 %   Basophils Absolute 0.0 0.0 - 0.1 K/uL  Basic metabolic panel  Result Value Ref Range   Sodium 138 135 - 145 mmol/L   Potassium 3.1 (L) 3.5 - 5.1 mmol/L   Chloride 108 101 - 111 mmol/L   CO2 19 (L) 22 - 32 mmol/L   Glucose, Bld 77 65 - 99 mg/dL   BUN 8 6 - 20 mg/dL   Creatinine, Ser 0.91 0.44 - 1.00 mg/dL   Calcium 7.8 (L) 8.9 - 10.3 mg/dL   GFR calc non Af Amer >60 >60 mL/min   GFR calc Af Amer >60 >60 mL/min   Anion gap 11 5 - 15  Urine microscopic-add on  Result Value Ref Range   Squamous  Epithelial / LPF MANY (A) RARE  WBC, UA TOO NUMEROUS TO COUNT <3 WBC/hpf   RBC / HPF TOO NUMEROUS TO COUNT <3 RBC/hpf   Bacteria, UA MANY (A) RARE   Urine-Other MUCOUS PRESENT   Urinalysis, Routine w reflex microscopic (not at [facility])  Result Value Ref Range   Color, Urine AMBER (A) YELLOW   APPearance CLOUDY (A) CLEAR   Specific Gravity, Urine 1.018 1.005 - 1.030   pH 6.0 5.0 - 8.0   Glucose, UA NEGATIVE NEGATIVE mg/dL   Hgb urine dipstick LARGE (A) NEGATIVE   Bilirubin Urine SMALL (A) NEGATIVE   Ketones, ur 40 (A) NEGATIVE mg/dL   Protein, ur 30 (A) NEGATIVE mg/dL   Urobilinogen, UA 1.0 0.0 - 1.0 mg/dL   Nitrite POSITIVE (A) NEGATIVE   Leukocytes, UA MODERATE (A) NEGATIVE  Urine microscopic-add on  Result Value Ref Range   Squamous Epithelial / LPF FEW (A) RARE   WBC, UA TOO NUMEROUS TO COUNT <3 WBC/hpf   RBC / HPF 21-50 <3 RBC/hpf   Bacteria, UA FEW (A) RARE  I-Stat CG4 Lactic Acid, ED  Result Value Ref Range   Lactic Acid, Venous 6.21 (HH) 0.5 - 2.0 mmol/L   Comment NOTIFIED PHYSICIAN   I-Stat CG4 Lactic Acid, ED  Result Value Ref Range   Lactic Acid, Venous 0.56 0.5 - 2.0 mmol/L    Imaging Review Dg Chest Port 1 View  04/18/2015   CLINICAL DATA:  Leukocytosis.  EXAM: PORTABLE CHEST - 1 VIEW  COMPARISON:  March 31, 2015.  FINDINGS: The heart size and mediastinal contours are within normal limits. No pneumothorax or pleural effusion is noted. Small dense nodular densities are noted in right upper lobe most consistent with granulomata. Rounded nodular density is noted in left upper lobe. The visualized skeletal structures are unremarkable.  IMPRESSION: Rounded nodular density seen in left upper lobe ; CT scan of the chest is recommended to evaluate for possible neoplasm.   Electronically Signed   By: Marijo Conception, M.D.   On: 04/18/2015 94:76   I, Delora Fuel, MD, personally reviewed and evaluated these images and lab results as part of my medical decision-making.   EKG  Interpretation   Date/Time:  Saturday April 17 2015 23:50:25 EDT Ventricular Rate:  148 PR Interval:  112 QRS Duration: 80 QT Interval:  336 QTC Calculation: 527 R Axis:   72 Text Interpretation:  Sinus tachycardia Nonspecific ST abnormality  Abnormal ECG No old tracing to compare Confirmed by [facility]  MD, Zaida Reiland  ([id number]) on 04/18/2015 8:56:21 AM      CRITICAL CARE Performed by: [email or web address],KCLEX Total critical care time: 50 minutes Critical care time was exclusive of separately billable procedures and treating other patients. Critical care was necessary to treat or prevent imminent or life-threatening deterioration. Critical care was time spent personally by me on the following activities: development of treatment plan with patient and/or surrogate as well as nursing, discussions with consultants, evaluation of patient's response to treatment, examination of patient, obtaining history from patient or surrogate, ordering and performing treatments and interventions, ordering and review of laboratory studies, ordering and review of radiographic studies, pulse oximetry and re-evaluation of patient's condition.  MDM   Final diagnoses:  Urinary tract infection with hematuria, site unspecified  Sinus tachycardia  Elevated lactic acid level  Dehydration  Renal insufficiency  Hypokalemia    Patient with dizziness and tachycardia. Laboratory workup significant for leukocytosis and elevated lactic acid. She is afebrile and mentating normally. She is given IV fluids and lactic  acid level was repeated and has come back down to normal. Orthostatic vital signs were obtained showing significant drop in blood pressure so she clearly needed to be admitted. Urinalysis was obtained relatively late in her ED stay and did show evidence of urinary tract infection. At this point, that seems to be the etiology of her tachycardia and hypertension. She does not appear overtly septic and has responded well to fluids  alone. She started on Vicente Males biotics for urinary tract infection and admitted to the hospitalist service.   I personally performed the services described in this documentation, which was scribed in my presence. The recorded information has been reviewed and is accurate.    Delora Fuel, MD 83/25/49 [id number]

## 2015-04-18 NOTE — ED Notes (Signed)
Pt denies Nausea at this time.

## 2015-04-19 DIAGNOSIS — E872 Acidosis: Secondary | ICD-10-CM

## 2015-04-19 DIAGNOSIS — N390 Urinary tract infection, site not specified: Secondary | ICD-10-CM

## 2015-04-19 DIAGNOSIS — E876 Hypokalemia: Secondary | ICD-10-CM | POA: Diagnosis not present

## 2015-04-19 DIAGNOSIS — I10 Essential (primary) hypertension: Secondary | ICD-10-CM

## 2015-04-19 DIAGNOSIS — R55 Syncope and collapse: Secondary | ICD-10-CM

## 2015-04-19 DIAGNOSIS — I951 Orthostatic hypotension: Secondary | ICD-10-CM

## 2015-04-19 DIAGNOSIS — R911 Solitary pulmonary nodule: Secondary | ICD-10-CM

## 2015-04-19 DIAGNOSIS — B9689 Other specified bacterial agents as the cause of diseases classified elsewhere: Secondary | ICD-10-CM

## 2015-04-19 DIAGNOSIS — R7989 Other specified abnormal findings of blood chemistry: Secondary | ICD-10-CM | POA: Diagnosis present

## 2015-04-19 DIAGNOSIS — E860 Dehydration: Secondary | ICD-10-CM

## 2015-04-19 DIAGNOSIS — F17210 Nicotine dependence, cigarettes, uncomplicated: Secondary | ICD-10-CM

## 2015-04-19 LAB — GLUCOSE, CAPILLARY
GLUCOSE-CAPILLARY: 77 mg/dL (ref 65–99)
Glucose-Capillary: 74 mg/dL (ref 65–99)

## 2015-04-19 LAB — BASIC METABOLIC PANEL
Anion gap: 13 (ref 5–15)
CALCIUM: 7.7 mg/dL — AB (ref 8.9–10.3)
CO2: 17 mmol/L — ABNORMAL LOW (ref 22–32)
CREATININE: 0.77 mg/dL (ref 0.44–1.00)
Chloride: 108 mmol/L (ref 101–111)
GFR calc Af Amer: >60 mL/min (ref >60)
Glucose, Bld: 44 mg/dL — CL (ref 65–99)
POTASSIUM: 2.9 mmol/L — AB (ref 3.5–5.1)
SODIUM: 138 mmol/L (ref 135–145)

## 2015-04-19 LAB — URINE CULTURE

## 2015-04-19 LAB — CBC
HCT: 32.1 % — ABNORMAL LOW (ref 36.0–46.0)
Hemoglobin: 10.6 g/dL — ABNORMAL LOW (ref 12.0–15.0)
MCH: 31.4 pg (ref 26.0–34.0)
MCHC: 33 g/dL (ref 30.0–36.0)
MCV: 95 fL (ref 78.0–100.0)
PLATELETS: 197 10*3/uL (ref 150–400)
RBC: 3.38 MIL/uL — AB (ref 3.87–5.11)
RDW: 14.4 % (ref 11.5–15.5)
WBC: 10.3 10*3/uL (ref 4.0–10.5)

## 2015-04-19 MED ORDER — POTASSIUM CHLORIDE CRYS ER 20 MEQ PO TBCR
40.0000 meq | EXTENDED_RELEASE_TABLET | ORAL | Status: AC
  Administered 2015-04-19 – 2015-04-20 (×3): 40 meq via ORAL
  Filled 2015-04-19 (×3): qty 2

## 2015-04-19 MED ORDER — DEXTROSE 5 % IV SOLN
1.0000 g | INTRAVENOUS | Status: DC
  Administered 2015-04-19 – 2015-04-20 (×2): 1 g via INTRAVENOUS
  Filled 2015-04-19 (×2): qty 10

## 2015-04-19 NOTE — Progress Notes (Signed)
Subjective: Patient is feeling improved today. She denies dizziness, nausea, or urinary symptoms. No other changes overnight.  Objective: Vital signs in last 24 hours: Filed Vitals:   04/18/15 2034 04/19/15 0454 04/19/15 0744 04/19/15 1354  BP: 91/53 93/48 103/57 117/61  Pulse: 94 97 98 98  Temp: 98.7 F (37.1 C) 98.6 F (37 C) 98.5 F (36.9 C) 98.3 F (36.8 C)  TempSrc: Oral Oral Oral Oral  Resp: 17 16 17 17   Height:      Weight: 115 lb 8.3 oz (52.4 kg)     SpO2: 95% 92% 92% 99%   Weight change: 1 lb 8.3 oz (0.69 kg)  Intake/Output Summary (Last 24 hours) at 04/19/15 1521 Last data filed at 04/19/15 1000  Gross per 24 hour  Intake    840 ml  Output   1100 ml  Net   -260 ml   General: resting in bed, no acute distress HEENT: EOMI, no scleral icterus Cardiac: tachycardic, no rubs, murmurs or gallops Pulm: bibasilar crackles Abd: soft, nontender, nondistended, BS present Ext: warm and well perfused, no pedal edema Neuro: alert and oriented X3  Lab Results: Basic Metabolic Panel:  Recent Labs Lab 04/18/15 0630 04/19/15 0437  NA 138 138  K 3.1* 2.9*  CL 108 108  CO2 19* 17*  GLUCOSE 77 44*  BUN 8 <5*  CREATININE 0.91 0.77  CALCIUM 7.8* 7.7*   Liver Function Tests:  Recent Labs Lab 04/17/15 2339  AST 21  ALT 9*  ALKPHOS 75  BILITOT 1.2  PROT 5.8*  ALBUMIN 2.7*    Recent Labs Lab 04/17/15 2339  LIPASE 16*   No results for input(s): AMMONIA in the last 168 hours. CBC:  Recent Labs Lab 04/17/15 2339 04/19/15 0437  WBC 18.0* 10.3  NEUTROABS 15.3*  --   HGB 13.8 10.6*  HCT 40.9 32.1*  MCV 91.9 95.0  PLT 289 197   Cardiac Enzymes: No results for input(s): CKTOTAL, CKMB, CKMBINDEX, TROPONINI in the last 168 hours. BNP: No results for input(s): PROBNP in the last 168 hours. D-Dimer: No results for input(s): DDIMER in the last 168 hours. CBG:  Recent Labs Lab 04/19/15 0847  GLUCAP 77   Hemoglobin A1C: No results for input(s):  HGBA1C in the last 168 hours. Fasting Lipid Panel: No results for input(s): CHOL, HDL, LDLCALC, TRIG, CHOLHDL, LDLDIRECT in the last 168 hours. Thyroid Function Tests: No results for input(s): TSH, T4TOTAL, FREET4, T3FREE, THYROIDAB in the last 168 hours. Coagulation: No results for input(s): LABPROT, INR in the last 168 hours. Anemia Panel: No results for input(s): VITAMINB12, FOLATE, FERRITIN, TIBC, IRON, RETICCTPCT in the last 168 hours. Urine Drug Screen: Drugs of Abuse  No results found for: LABOPIA, COCAINSCRNUR, LABBENZ, AMPHETMU, THCU, LABBARB  Alcohol Level: No results for input(s): ETH in the last 168 hours. Urinalysis:  Recent Labs Lab 04/18/15 0737 04/18/15 1509  COLORURINE AMBER* AMBER*  LABSPEC 1.020 1.018  PHURINE 6.0 6.0  GLUCOSEU NEGATIVE NEGATIVE  HGBUR LARGE* LARGE*  BILIRUBINUR SMALL* SMALL*  KETONESUR >80* 40*  PROTEINUR 100* 30*  UROBILINOGEN 1.0 1.0  NITRITE POSITIVE* POSITIVE*  LEUKOCYTESUR LARGE* MODERATE*    Micro Results: Recent Results (from the past 240 hour(s))  Urine culture     Status: None   Collection Time: 04/18/15  7:35 AM  Result Value Ref Range Status   Specimen Description URINE, RANDOM  Final   Special Requests NONE  Final   Culture MULTIPLE SPECIES PRESENT, SUGGEST RECOLLECTION  Final  Report Status 04/19/2015 FINAL  Final   Studies/Results: Dg Chest Port 1 View  04/18/2015   CLINICAL DATA:  Leukocytosis.  EXAM: PORTABLE CHEST - 1 VIEW  COMPARISON:  March 31, 2015.  FINDINGS: The heart size and mediastinal contours are within normal limits. No pneumothorax or pleural effusion is noted. Small dense nodular densities are noted in right upper lobe most consistent with granulomata. Rounded nodular density is noted in left upper lobe. The visualized skeletal structures are unremarkable.  IMPRESSION: Rounded nodular density seen in left upper lobe ; CT scan of the chest is recommended to evaluate for possible neoplasm.   Electronically  Signed   By: Marijo Conception, M.D.   On: 04/18/2015 10:02   Medications: I have reviewed the patient's current medications. Scheduled Meds: . cefTRIAXone (ROCEPHIN)  IV  1 g Intravenous Q24H  . enoxaparin (LOVENOX) injection  40 mg Subcutaneous Q24H  . potassium chloride  40 mEq Oral Q4H  . sodium chloride  3 mL Intravenous Q12H   Continuous Infusions: . sodium chloride 1,000 mL (04/18/15 2315)   PRN Meds:.acetaminophen **OR** acetaminophen, ondansetron **OR** ondansetron (ZOFRAN) IV Assessment/Plan: Principal Problem:   UTI (lower urinary tract infection) Active Problems:   Orthostatic hypotension   Near syncope   Solitary pulmonary nodule   Dehydration   Elevated lactic acid level   Hypokalemia  UTI: UA show WBCs/RBCs too numerous to count, positive nitrite, large leukocytes. Patient continues without urinary symptoms. Serum WBC down to 10.3 compared to 18.0 on admission.  -Continue Ceftriaxone 1 gram IV -Urine culture shows multiple species present -Continue IV NS 125 mL/hr  Solitary pulmonary nodule: CXR shows rounded nodular density in left upper lobe. Patient has 0.5 PPD smoking history for 40+ years. -CT chest w/ contrast tomorrow  Hypokalemia: Potassium has dipped to 2.9 down from 3.1. -K-Dur 40 mEq q4h x 3 -BMP in the AM  Orthostatic hypotension: Likely 2/2 dehydration, resolved after fluid resuscitation in ED -Continue IV NS   Dispo: Disposition is deferred at this time, awaiting improvement of current medical problems.  Anticipated discharge in approximately 1-2 day(s).   The patient does not have a current PCP (No Pcp Per Patient) and does need an [facility] hospital follow-up appointment after discharge.  The patient does not have transportation limitations that hinder transportation to clinic appointments.  .Services Needed at time of discharge: Y = Yes, Blank = No PT:   OT:   RN:   Equipment:   Other:     LOS: 1 day   Zada Finders, MD 04/19/2015, 3:21  PM

## 2015-04-19 NOTE — Progress Notes (Signed)
Checked blood sugar-77.

## 2015-04-20 ENCOUNTER — Inpatient Hospital Stay (HOSPITAL_COMMUNITY): Payer: Medicare Other

## 2015-04-20 DIAGNOSIS — A419 Sepsis, unspecified organism: Secondary | ICD-10-CM | POA: Diagnosis present

## 2015-04-20 DIAGNOSIS — E162 Hypoglycemia, unspecified: Secondary | ICD-10-CM | POA: Diagnosis present

## 2015-04-20 DIAGNOSIS — R6520 Severe sepsis without septic shock: Secondary | ICD-10-CM

## 2015-04-20 LAB — BASIC METABOLIC PANEL
Anion gap: 11 (ref 5–15)
CALCIUM: 7.5 mg/dL — AB (ref 8.9–10.3)
CO2: 16 mmol/L — AB (ref 22–32)
CREATININE: 0.73 mg/dL (ref 0.44–1.00)
Chloride: 112 mmol/L — ABNORMAL HIGH (ref 101–111)
GFR calc non Af Amer: >60 mL/min (ref >60)
GLUCOSE: 57 mg/dL — AB (ref 65–99)
Potassium: 3.8 mmol/L (ref 3.5–5.1)
Sodium: 139 mmol/L (ref 135–145)

## 2015-04-20 MED ORDER — ONDANSETRON 8 MG PO TBDP: 8.0000 mg | ORAL_TABLET | Freq: Three times a day (TID) | ORAL | Status: DC | PRN

## 2015-04-20 MED ORDER — LOPERAMIDE HCL 2 MG PO TABS: ORAL_TABLET | ORAL | Status: DC

## 2015-04-20 MED ORDER — CIPROFLOXACIN HCL 500 MG PO TABS: 500.0000 mg | ORAL_TABLET | Freq: Two times a day (BID) | ORAL | Status: DC

## 2015-04-20 MED ORDER — IOHEXOL 300 MG/ML  SOLN
100.0000 mL | Freq: Once | INTRAMUSCULAR | Status: AC | PRN
  Administered 2015-04-20: 75 mL via INTRAVENOUS

## 2015-04-20 MED ORDER — CIPROFLOXACIN HCL 500 MG PO TABS: 500.0000 mg | ORAL_TABLET | Freq: Two times a day (BID) | ORAL | Status: AC

## 2015-04-20 NOTE — Clinical Documentation Improvement (Signed)
Would you please help clarify the medical condition related to clinical findings? Sepsis from UTI Severe Sepsis Severe sepsis with shock Other clinical explanation Unable to determine  Clinical Findings -  Pt admitted with GI symptoms.  Initial V/S were Pulse = 147, T = 97.4, BP 104/61; R = 18.  Lactic acid was 6.21 and had 3 L of fluid.  Lactic acid dropped to 0.56.  Also thought that Hypotension was orthostatic.  BP Lying - 93/54; Sitting - 78/62; Standing - 66/39.  MAP was as low as 63.  At 9am another set of orthostatic BP were taken - Lying - 93/75; Sitting 102/72; Standing - 112/65.  This was all after fluid boluses.  On D/C from ED BP was 94/59, R= 22; P - 98; MAP 67. After arrival to floor, BP fell to 89/58, MAP 66, Pulse - 121 and R - 25.  Orthostatic BP completed again. Lying 93/54, Sitting - 78/62, Standing - 66/39.  All BPs showing Hypotension.  Patient diagnosed with UTI  Thank You, Margretta Sidle ,RN Clinical Documentation Specialist:    [facility] [phone] Cell - [phone]

## 2015-04-20 NOTE — Discharge Instructions (Addendum)
I have prescribed you oral antibiotics for your UTI. This is Ciprofloxacin 500 mg to be taken twice a day. It is important that you complete this course of antibiotics.  I have also schedule you for a follow up visit with [facility] on Thursday August, 25th 2016. This is at 2:30 pm with Dr. Melvyn Novas, please arrive at least 15 minutes prior to your appointment. It is important that you follow up.  I have also ordered an imaging test called a PET-CT to look more closely at the nodules we saw on your x-ray and CT scan.  I have also scheduled you a follow up appointment at 10:15 am on September 1st, 2016 at the [facility] located at this hospital.

## 2015-04-20 NOTE — Progress Notes (Signed)
Utilization review completed. Kassidy Frankson, RN, BSN. 

## 2015-04-20 NOTE — Progress Notes (Signed)
Subjective: Patient states she is feeling well today, though not back to her baseline. She is able to walk to the bathroom, denies any SOB. She does state that she has decreased appetite but is drinking fluids. No changes overnight. Objective: Vital signs in last 24 hours: Filed Vitals:   04/19/15 1354 04/19/15 2100 04/20/15 0430 04/20/15 0859  BP: 117/61 111/63 131/71 126/70  Pulse: 98 88 103 92  Temp: 98.3 F (36.8 C) 98.1 F (36.7 C) 97.5 F (36.4 C) 97.7 F (36.5 C)  TempSrc: Oral  Oral Oral  Resp: 17 13 16 16   Height:      Weight:  115 lb 14.4 oz (52.572 kg)    SpO2: 99% 97% 95% 95%   Weight change: 6.1 oz (0.172 kg)  Intake/Output Summary (Last 24 hours) at 04/20/15 1411 Last data filed at 04/20/15 1200  Gross per 24 hour  Intake 2121.25 ml  Output    500 ml  Net 1621.25 ml   General: resting in bed HEENT: EOMI, no scleral icterus Cardiac: slightly tachycardic, no rubs, murmurs or gallops Pulm: bibasilar crackles on auscultation Abd: soft, nontender, nondistended, BS present Ext: warm and well perfused, no pedal edema Neuro: alert and oriented X3 Skin: 2 small abrasions on left knee, no moles or skin lesions appreciated on body exam  Lab Results: Basic Metabolic Panel:  Recent Labs Lab 04/19/15 0437 04/20/15 0505  NA 138 139  K 2.9* 3.8  CL 108 112*  CO2 17* 16*  GLUCOSE 44* 57*  BUN <5* <5*  CREATININE 0.77 0.73  CALCIUM 7.7* 7.5*   Liver Function Tests:  Recent Labs Lab 04/17/15 2339  AST 21  ALT 9*  ALKPHOS 75  BILITOT 1.2  PROT 5.8*  ALBUMIN 2.7*    Recent Labs Lab 04/17/15 2339  LIPASE 16*   No results for input(s): AMMONIA in the last 168 hours. CBC:  Recent Labs Lab 04/17/15 2339 04/19/15 0437  WBC 18.0* 10.3  NEUTROABS 15.3*  --   HGB 13.8 10.6*  HCT 40.9 32.1*  MCV 91.9 95.0  PLT 289 197   Cardiac Enzymes: No results for input(s): CKTOTAL, CKMB, CKMBINDEX, TROPONINI in the last 168 hours. BNP: No results for  input(s): PROBNP in the last 168 hours. D-Dimer: No results for input(s): DDIMER in the last 168 hours. CBG:  Recent Labs Lab 04/19/15 0847 04/19/15 1623  GLUCAP 77 74   Hemoglobin A1C: No results for input(s): HGBA1C in the last 168 hours. Fasting Lipid Panel: No results for input(s): CHOL, HDL, LDLCALC, TRIG, CHOLHDL, LDLDIRECT in the last 168 hours. Thyroid Function Tests: No results for input(s): TSH, T4TOTAL, FREET4, T3FREE, THYROIDAB in the last 168 hours. Coagulation: No results for input(s): LABPROT, INR in the last 168 hours. Anemia Panel: No results for input(s): VITAMINB12, FOLATE, FERRITIN, TIBC, IRON, RETICCTPCT in the last 168 hours. Urine Drug Screen: Drugs of Abuse  No results found for: LABOPIA, COCAINSCRNUR, LABBENZ, AMPHETMU, THCU, LABBARB  Alcohol Level: No results for input(s): ETH in the last 168 hours. Urinalysis:  Recent Labs Lab 04/18/15 0737 04/18/15 1509  COLORURINE AMBER* AMBER*  LABSPEC 1.020 1.018  PHURINE 6.0 6.0  GLUCOSEU NEGATIVE NEGATIVE  HGBUR LARGE* LARGE*  BILIRUBINUR SMALL* SMALL*  KETONESUR >80* 40*  PROTEINUR 100* 30*  UROBILINOGEN 1.0 1.0  NITRITE POSITIVE* POSITIVE*  LEUKOCYTESUR LARGE* MODERATE*     Micro Results: Recent Results (from the past 240 hour(s))  Urine culture     Status: None   Collection Time:  04/18/15  7:35 AM  Result Value Ref Range Status   Specimen Description URINE, RANDOM  Final   Special Requests NONE  Final   Culture MULTIPLE SPECIES PRESENT, SUGGEST RECOLLECTION  Final   Report Status 04/19/2015 FINAL  Final   Studies/Results: Ct Chest W Contrast  04/20/2015   CLINICAL DATA:  Nonproductive cough. Dizziness. Left upper lobe nodular density seen on chest radiograph.  EXAM: CT CHEST WITH CONTRAST  TECHNIQUE: Multidetector CT imaging of the chest was performed during intravenous contrast administration.  CONTRAST:  69mL OMNIPAQUE IOHEXOL 300 MG/ML  SOLN  COMPARISON:  Chest radiograph on 04/18/2015   FINDINGS: Mediastinum/Lymph Nodes: No masses or pathologically enlarged lymph nodes identified. A moderate hiatal hernia is seen.  Lungs/Pleura: Small to moderate pleural effusions are seen bilaterally with mild dependent atelectasis. Mild emphysema noted.  Three noncalcified pulmonary nodules are seen in the left lower lobe, largest measuring 9 mm. Two pulmonary nodules are seen in the right upper lobe, largest measuring 7 mm.  Musculoskeletal/Soft Tissues: No suspicious bone lesions or other significant chest wall abnormality.  Upper Abdomen:  Unremarkable.  IMPRESSION: Several bilateral pulmonary nodules, largest in the left lower lobe measuring 9 mm. Metastatic disease cannot be excluded. Consider PET-CT scan for further evaluation.  Small to moderate bilateral pleural effusions with mild dependent atelectasis.  Mild emphysema.  Moderate hiatal hernia.   Electronically Signed   By: Earle Gell M.D.   On: 04/20/2015 08:38   Medications: I have reviewed the patient's current medications. Scheduled Meds: . [START ON 04/21/2015] ciprofloxacin  500 mg Oral BID  . enoxaparin (LOVENOX) injection  40 mg Subcutaneous Q24H  . sodium chloride  3 mL Intravenous Q12H   Continuous Infusions:   PRN Meds:.acetaminophen **OR** acetaminophen, ondansetron **OR** ondansetron (ZOFRAN) IV Assessment/Plan: Principal Problem:   Severe sepsis Active Problems:   UTI (lower urinary tract infection)   Orthostatic hypotension   Near syncope   Multiple pulmonary nodules   Elevated lactic acid level   Hypokalemia   Hypoglycemia  Severe sepsis due to UTI: UA showed WBCs/RBCs too numerous to count, positive nitrite, large leukocytes. Patient denies any urinary symptoms. She had lactic acid of 6.21 on admission, which improved to 0.56 after 2 liters NS IV. -Discontinue IV Ceftriaxone -Start Ciprofloxacin 500 mg po BID for 10 days to be continued outpatient  Multiple pulmonary nodule: Patient has 0.5 PPD smoking history  for 40+ years. She states that she has quit smoking 2 weeks ago. CXR showed rounded nodular density in left upper lobe. -CT chest w/ contrast shows: Several bilateral pulmonary nodules, largest in left lower lobe at 9 mm. Small to moderate bilateral pleural effusions w/ mild dependent atelectasis. Concern is for a metastatic malignancy. Patient was offered to have fluid drained at bedside, but prefers to have done as outpatient procedure.  -Will schedule outpatient PET-CT -Patient scheduled for f/u w [facility] on 04/29/2015.  Hypokalemia: Potassium up to 3.8 after supplementation.  Orthostatic hypotension/near syncope: Likely 2/2 sepsis and dehydration, resolved after fluid resuscitation in ED  Hypoglycemia: Patient's CBGs have been in 50s-70s. Patient not eating due to appetite, but drinking fluids. -Patient advised to try to eat as best she can to keep her blood sugars up. She is drinking orange juice and has received dextrose for low glucose levels.    Dispo: Disposition is deferred at this time, awaiting improvement of current medical problems.  Anticipated discharge in approximately today(s).   The patient does not have a current PCP (  No Pcp Per Patient) and does need an [facility] hospital follow-up appointment after discharge.  The patient does not have transportation limitations that hinder transportation to clinic appointments.  .Services Needed at time of discharge: Y = Yes, Blank = No PT:   OT:   RN:   Equipment:   Other:     LOS: 2 days   Zada Finders, MD 04/20/2015, 2:11 PM

## 2015-04-20 NOTE — Progress Notes (Signed)
I saw and evaluated the patient. I reviewed the resident's note and I agree with the resident's findings and plan as documented in the resident's note.  69 year old woman who is recovering well from a polymicrobial urinary tract infection. Now tolerating oral food without difficulty, I think she'll be able to transition to an oral antibiotic to complete a 10 day course for complicated UTI. We talked for a while about her multiple pulmonary lesions on CT scan along with the bilateral pleural effusions. Differential would include metastatic carcinoma from another site for advanced stage primary lung cancer. She does not have a primary care physician, has completed age-appropriate screening in the past but is not up-to-date on several things including mammography and cervical cancer screening. No other infectious symptoms or productive sputum at this point, so I think it is low probability that these are an infectious process like mycobacterium or fungal infection. At some point the pleural effusion should be drained and sent for analysis, I anticipate it is going to be an exudative process. The fluid can also be sent for cytology to evaluate for malignancy, may need serial taps give low yield of this test. We offered this procedure during this hospitalization but the patient would rather follow-up as an outpatient. She has no hypoxia and a good exertional capacity so I think it's reasonable to wait. We are also going to order an outpatient PET scan to evaluate for another primary tumor. She'll be set up to follow with pulmonology and will likely need a multidisciplinary thoracic approach in the future. We will also set her up to follow in the [facility] for ongoing age-appropriate cancer screening and further primary care.

## 2015-04-20 NOTE — Discharge Summary (Signed)
Name: Carly Schwartz MRN: [id number] DOB: 12-05-1945 69 y.o. PCP: No Pcp Per Patient  Date of Admission: 04/18/2015  2:22 AM Date of Discharge: 04/20/2015 Attending Physician: Axel Filler, MD  Discharge Diagnosis: 1. Severe sepsis due to UTI  Principal Problem:   Severe sepsis Active Problems:   UTI (lower urinary tract infection)   Orthostatic hypotension   Near syncope   Multiple pulmonary nodules   Elevated lactic acid level   Hypokalemia   Hypoglycemia  Discharge Medications:   Medication List    TAKE these medications        ciprofloxacin 500 MG tablet  Commonly known as:  CIPRO  Take 1 tablet (500 mg total) by mouth 2 (two) times daily.  Start taking on:  04/21/2015     loperamide 2 MG tablet  Commonly known as:  IMODIUM A-D  2 now and one hourly prn loose stool  Max 8 in 24 hours     ondansetron 8 MG disintegrating tablet  Commonly known as:  ZOFRAN-ODT  Take 1 tablet (8 mg total) by mouth every 8 (eight) hours as needed for nausea.        Disposition and follow-up:   Ms.Karlee A Silverstein was discharged from [facility] in Stable condition.  At the hospital follow up visit please address:  1.  Pulmonary nodules/pleural effusion: Patient is to have PET scan obtained outpatient for further evaluation. She has multiple pulmonary nodules and bilateral pleural effusion that will likely need to be drained and sent for analysis/cytology due to concern for metastatic origin. She is to follow up with pulmonology Dr. Melvyn Novas on 04/29/2015.   Sepsis due to UTI: Patient responded to IV Ceftriaxone during admission and is prescribed 10 day course of Ciprofloxacin 500 mg BID on discharge with last day 04/30/2015. Please follow up on patient adherence and completion of antibiotics and any concerning symptoms.  Age appropriate cancer screening: Patient scheduled for follow up at [facility] for further primary care. Please evaluate for health  maintanence, breast/colon/cervicalcancer screening, and smoking status. Patient reports quitting smoking 2 weeks prior to hospital visit.  2.  Labs / imaging needed at time of follow-up: pleural effusion fluid analysis/cytology  3.  Pending labs/ test needing follow-up: PET-CT skull to thigh  Follow-up Appointments: Follow-up Information    Follow up with Christinia Gully, MD. Go on 04/29/2015.   Specialty:  Pulmonary Disease   Why:  Appointment at 2:30 pm to evaluate your lung nodules and fluid.   Contact information:   [address] [place] [address] [phone]       Follow up with Zada Finders, MD. Go on 05/06/2015.   Specialty:  Internal Medicine   Why:  10:15 am.   Contact information:   [address] [place] [address] [phone]       Discharge Instructions: Discharge Instructions    Call MD for:  difficulty breathing, headache or visual disturbances    Complete by:  As directed      Call MD for:  extreme fatigue    Complete by:  As directed      Call MD for:  persistant nausea and vomiting    Complete by:  As directed      Call MD for:  severe uncontrolled pain    Complete by:  As directed      Call MD for:  temperature >100.4    Complete by:  As directed  Diet - low sodium heart healthy    Complete by:  As directed      Increase activity slowly    Complete by:  As directed            Consultations:  n/a  Procedures Performed:  Dg Chest 2 View  03/31/2015   CLINICAL DATA:  Nausea, diarrhea, leukocytosis.  EXAM: CHEST  2 VIEW  COMPARISON:  None  FINDINGS: There is a suggestion of upper lobe nodularity, right greater than left, superimposed on more generalized interstitial coarsening. There is probably also is moderate upper lobe emphysematous change. Hilar and mediastinal contours are unremarkable. There are no pleural effusions. Heart size is normal.  IMPRESSION: Question nodularity in the upper lobes, superimposed on emphysematous changes and  interstitial coarsening. Consider chest CT for optimal characterization.   Electronically Signed   By: Andreas Newport M.D.   On: 03/31/2015 23:00   Ct Chest W Contrast  04/20/2015   CLINICAL DATA:  Nonproductive cough. Dizziness. Left upper lobe nodular density seen on chest radiograph.  EXAM: CT CHEST WITH CONTRAST  TECHNIQUE: Multidetector CT imaging of the chest was performed during intravenous contrast administration.  CONTRAST:  50mL OMNIPAQUE IOHEXOL 300 MG/ML  SOLN  COMPARISON:  Chest radiograph on 04/18/2015  FINDINGS: Mediastinum/Lymph Nodes: No masses or pathologically enlarged lymph nodes identified. A moderate hiatal hernia is seen.  Lungs/Pleura: Small to moderate pleural effusions are seen bilaterally with mild dependent atelectasis. Mild emphysema noted.  Three noncalcified pulmonary nodules are seen in the left lower lobe, largest measuring 9 mm. Two pulmonary nodules are seen in the right upper lobe, largest measuring 7 mm.  Musculoskeletal/Soft Tissues: No suspicious bone lesions or other significant chest wall abnormality.  Upper Abdomen:  Unremarkable.  IMPRESSION: Several bilateral pulmonary nodules, largest in the left lower lobe measuring 9 mm. Metastatic disease cannot be excluded. Consider PET-CT scan for further evaluation.  Small to moderate bilateral pleural effusions with mild dependent atelectasis.  Mild emphysema.  Moderate hiatal hernia.   Electronically Signed   By: Earle Gell M.D.   On: 04/20/2015 08:38   Dg Chest Port 1 View  04/18/2015   CLINICAL DATA:  Leukocytosis.  EXAM: PORTABLE CHEST - 1 VIEW  COMPARISON:  March 31, 2015.  FINDINGS: The heart size and mediastinal contours are within normal limits. No pneumothorax or pleural effusion is noted. Small dense nodular densities are noted in right upper lobe most consistent with granulomata. Rounded nodular density is noted in left upper lobe. The visualized skeletal structures are unremarkable.  IMPRESSION: Rounded nodular  density seen in left upper lobe ; CT scan of the chest is recommended to evaluate for possible neoplasm.   Electronically Signed   By: Marijo Conception, M.D.   On: 04/18/2015 10:02    2D Echo: n/a  Cardiac Cath: n/a  Admission HPI: Ms. Rashaunda Rahl is a 69 year old lady with PMH of HTN who presents with complaints of 1 week history of occasional dizziness/lightheadedness, nausea, and right ear pain. She states her dizziness occurs when she stands up from sitting or laying position and describes it more as a lightheaded feeling without a sensation that the room is spinning. She states that she has not vomited, but has had dry heaves. She does endorse a new non-productive cough that started yesterday. Of note, she came to the ED 2.5 weeks ago with symptoms of diarrhea and vomiting. Her WBC was 22 at the time and the ED provider counseled her  on evaluation for serious infection, but she stated that she felt fine and only wanted medication for her nausea. Afterwards, her symptoms resolved until 1 week ago. She denies any dysuria, urgency, or frequency however UA in the ED shows WBCs/RBCs too numerous to count, positive nitrite, large leukocytes, and many squamous epithelial cells. Her lactic acid was elevated at 6.21, she was given 2L IV NS and lactic acid came down to 0.56. She was started on Ceftriaxone 1 gram IV in the ED. She denies any discharge or vaginal symptoms and states that she has not been sexually active for some time. She is post-menopausal and denies any bleeding.  She states that she has fallen twice in the last month or two, without any prodrome or loss of consciousness. She denies any injury to herself other than some scrapes to her knee. Her blood pressure has been between 90-100s/50-60s. She does smoke 0.5 PPD since she was 69 years old and does endorse some recent SOB when climbing stairs.  Vitals on arrival to ED: BP 104/61, P-147, RR-18, Temp 97.4, SpO2 97% on room air.  Hospital  Course by problem list: Principal Problem:   Severe sepsis Active Problems:   UTI (lower urinary tract infection)   Orthostatic hypotension   Near syncope   Multiple pulmonary nodules   Elevated lactic acid level   Hypokalemia   Hypoglycemia   Severe Sepsis due to urinary tract infection: Patient had no typical symptoms of UTI but reported nausea and dizziness/lightheadedness. She was found to be hypotensive and a CBC was notable for a leukocytosis of 18. An initial urinalysis was suggestive of a UTI but there were concerns of sample contamination due to the presence of numerous epithelial cells. A repeat urinalysis however, was consistent with a UTI with positive nitrates and leukocytes. A urine microscopy revealed numerous WBC, RBC and bacteria. Empiric intravenous ceftriaxone was instituted while awaiting results of urine culture and sensitivity. Patient had lactic acidosis of 6.21 on arrival to ED, this was probably due to hypoperfusion from the urinary tract infection. Resolved following intravenous rehydration with 2L of 0.9% normal saline, lactic acidosis trended down to 0.56. Patient was discharged on Ciprofloxacin 500 mg BID for 10 day course with last day on 04/30/2015.  Multiple Pulmonary Nodules: Chest radiograph was done as part of the evaluation for the patient's leukocytosis and lactic acidosis revealed small dense nodular densities in the right upper lobe consistent with granulomata and a rounded nodular density in the left upper lobe. In the setting of normal previous chest radiographs that were negative for pulmonary nodules, 0.5 PPD for >40 years smoking history and the patient's age, the pulmonary nodule was concerning for suspected malignancy. A chest CT scan evaluation confirmed bilateral nodules and bilateral pleural effusions. Discussed with patient the option for having pleural effusion drained and sent for analysis at bedside, but she preferred to have that done as an  outpatient procedure. She is to have PET scan of skull to thigh done outpatient, and has appointment   Orthostatic Hypotension/near syncope: Patient was rehydrated with intravenous fluids. After the initial 2L she was maintained on 166mL/hr continuous intravenous rehydration until the day of discharge.  Hypokalemia: Patient's potassium trended from 3.5 to 3.1. She received 68mEq of Kcl every 4hours, a total of 3 doses with improvement to 3.8.  Hypoglycemia: Patient's CBGs have been in 50s-70s. Patient was not eating due to appetite and nausea, which resolved.   Discharge Vitals:   BP 126/70 mmHg  Pulse  92  Temp(Src) 97.7 F (36.5 C) (Oral)  Resp 16  Ht 5\' 2"  (1.575 m)  Wt 115 lb 14.4 oz (52.572 kg)  BMI 21.19 kg/m2  SpO2 95%  Discharge Labs:  Results for orders placed or performed during the hospital encounter of 04/18/15 (from the past 24 hour(s))  Basic metabolic panel     Status: Abnormal   Collection Time: 04/20/15  5:05 AM  Result Value Ref Range   Sodium 139 135 - 145 mmol/L   Potassium 3.8 3.5 - 5.1 mmol/L   Chloride 112 (H) 101 - 111 mmol/L   CO2 16 (L) 22 - 32 mmol/L   Glucose, Bld 57 (L) 65 - 99 mg/dL   BUN <5 (L) 6 - 20 mg/dL   Creatinine, Ser 0.73 0.44 - 1.00 mg/dL   Calcium 7.5 (L) 8.9 - 10.3 mg/dL   GFR calc non Af Amer >60 >60 mL/min   GFR calc Af Amer >60 >60 mL/min   Anion gap 11 5 - 15    Signed: Zada Finders, MD 04/20/2015, 5:02 PM    Services Ordered on Discharge: none Equipment Ordered on Discharge: none

## 2015-04-29 ENCOUNTER — Institutional Professional Consult (permissible substitution): Payer: Medicare Other | Admitting: Internal Medicine

## 2015-05-05 ENCOUNTER — Telehealth: Payer: Self-pay | Admitting: Internal Medicine

## 2015-05-05 NOTE — Telephone Encounter (Signed)
Call to patient to confirm appointment for 05/06/15 at 10:15 lmtcb

## 2015-05-06 ENCOUNTER — Encounter: Payer: Self-pay | Admitting: General Practice

## 2015-05-06 ENCOUNTER — Ambulatory Visit: Payer: Medicare Other | Admitting: Internal Medicine

## 2015-05-18 ENCOUNTER — Inpatient Hospital Stay (HOSPITAL_COMMUNITY)
Admission: EM | Admit: 2015-05-18 | Discharge: 2015-05-22 | DRG: 853 | Disposition: A | Discharge status: Home / Self Care | Payer: Medicare Other | Attending: Internal Medicine | Admitting: Internal Medicine

## 2015-05-18 ENCOUNTER — Encounter (HOSPITAL_COMMUNITY): Payer: Self-pay | Admitting: Emergency Medicine

## 2015-05-18 ENCOUNTER — Emergency Department (HOSPITAL_COMMUNITY): Payer: Medicare Other

## 2015-05-18 DIAGNOSIS — A09 Infectious gastroenteritis and colitis, unspecified: Secondary | ICD-10-CM | POA: Diagnosis present

## 2015-05-18 DIAGNOSIS — A419 Sepsis, unspecified organism: Secondary | ICD-10-CM | POA: Diagnosis not present

## 2015-05-18 DIAGNOSIS — R6881 Early satiety: Secondary | ICD-10-CM | POA: Diagnosis present

## 2015-05-18 DIAGNOSIS — R1900 Intra-abdominal and pelvic swelling, mass and lump, unspecified site: Secondary | ICD-10-CM | POA: Insufficient documentation

## 2015-05-18 DIAGNOSIS — I10 Essential (primary) hypertension: Secondary | ICD-10-CM | POA: Diagnosis present

## 2015-05-18 DIAGNOSIS — R630 Anorexia: Secondary | ICD-10-CM | POA: Diagnosis present

## 2015-05-18 DIAGNOSIS — C519 Malignant neoplasm of vulva, unspecified: Secondary | ICD-10-CM | POA: Diagnosis present

## 2015-05-18 DIAGNOSIS — R6521 Severe sepsis with septic shock: Secondary | ICD-10-CM | POA: Diagnosis present

## 2015-05-18 DIAGNOSIS — R634 Abnormal weight loss: Secondary | ICD-10-CM | POA: Diagnosis present

## 2015-05-18 DIAGNOSIS — R579 Shock, unspecified: Secondary | ICD-10-CM | POA: Diagnosis present

## 2015-05-18 DIAGNOSIS — Z87440 Personal history of urinary (tract) infections: Secondary | ICD-10-CM

## 2015-05-18 DIAGNOSIS — N9089 Other specified noninflammatory disorders of vulva and perineum: Secondary | ICD-10-CM

## 2015-05-18 DIAGNOSIS — N368 Other specified disorders of urethra: Secondary | ICD-10-CM | POA: Diagnosis present

## 2015-05-18 DIAGNOSIS — N9489 Other specified conditions associated with female genital organs and menstrual cycle: Secondary | ICD-10-CM | POA: Diagnosis present

## 2015-05-18 DIAGNOSIS — Z87891 Personal history of nicotine dependence: Secondary | ICD-10-CM

## 2015-05-18 DIAGNOSIS — N898 Other specified noninflammatory disorders of vagina: Secondary | ICD-10-CM | POA: Insufficient documentation

## 2015-05-18 DIAGNOSIS — R918 Other nonspecific abnormal finding of lung field: Secondary | ICD-10-CM | POA: Diagnosis present

## 2015-05-18 DIAGNOSIS — R5383 Other fatigue: Secondary | ICD-10-CM | POA: Diagnosis not present

## 2015-05-18 DIAGNOSIS — D649 Anemia, unspecified: Secondary | ICD-10-CM | POA: Diagnosis present

## 2015-05-18 DIAGNOSIS — Z6821 Body mass index (BMI) 21.0-21.9, adult: Secondary | ICD-10-CM

## 2015-05-18 LAB — CBC WITH DIFFERENTIAL/PLATELET
Basophils Absolute: 0 10*3/uL (ref 0.0–0.1)
Basophils Relative: 0 % (ref 0–1)
EOS ABS: 0.2 10*3/uL (ref 0.0–0.7)
EOS PCT: 1 % (ref 0–5)
HCT: 32.9 % — ABNORMAL LOW (ref 36.0–46.0)
Hemoglobin: 10.6 g/dL — ABNORMAL LOW (ref 12.0–15.0)
LYMPHS PCT: 3 % — AB (ref 12–46)
Lymphs Abs: 0.7 10*3/uL (ref 0.7–4.0)
MCH: 30.4 pg (ref 26.0–34.0)
MCHC: 32.2 g/dL (ref 30.0–36.0)
MCV: 94.3 fL (ref 78.0–100.0)
MONO ABS: 1.2 10*3/uL — AB (ref 0.1–1.0)
Monocytes Relative: 5 % (ref 3–12)
NEUTROS PCT: 91 % — AB (ref 43–77)
Neutro Abs: 21.1 10*3/uL — ABNORMAL HIGH (ref 1.7–7.7)
PLATELETS: 286 10*3/uL (ref 150–400)
RBC: 3.49 MIL/uL — AB (ref 3.87–5.11)
RDW: 15 % (ref 11.5–15.5)
WBC: 23.2 10*3/uL — AB (ref 4.0–10.5)

## 2015-05-18 LAB — COMPREHENSIVE METABOLIC PANEL
ALT: 8 U/L — AB (ref 14–54)
ANION GAP: 12 (ref 5–15)
AST: 21 U/L (ref 15–41)
Albumin: 2.4 g/dL — ABNORMAL LOW (ref 3.5–5.0)
Alkaline Phosphatase: 63 U/L (ref 38–126)
BUN: 22 mg/dL — ABNORMAL HIGH (ref 6–20)
CHLORIDE: 101 mmol/L (ref 101–111)
CO2: 26 mmol/L (ref 22–32)
CREATININE: 0.97 mg/dL (ref 0.44–1.00)
Calcium: 10.8 mg/dL — ABNORMAL HIGH (ref 8.9–10.3)
GFR, EST NON AFRICAN AMERICAN: 58 mL/min — AB (ref >60)
Glucose, Bld: 111 mg/dL — ABNORMAL HIGH (ref 65–99)
Potassium: 4 mmol/L (ref 3.5–5.1)
Sodium: 139 mmol/L (ref 135–145)
Total Bilirubin: 1 mg/dL (ref 0.3–1.2)
Total Protein: 5.3 g/dL — ABNORMAL LOW (ref 6.5–8.1)

## 2015-05-18 LAB — I-STAT CG4 LACTIC ACID, ED: LACTIC ACID, VENOUS: 2.87 mmol/L — AB (ref 0.5–2.0)

## 2015-05-18 MED ORDER — SODIUM CHLORIDE 0.9 % IV BOLUS (SEPSIS)
1000.0000 mL | INTRAVENOUS | Status: AC
  Administered 2015-05-18 (×2): 1000 mL via INTRAVENOUS

## 2015-05-18 MED ORDER — DEXTROSE 5 % IV SOLN
2.0000 g | Freq: Once | INTRAVENOUS | Status: AC
  Administered 2015-05-18: 2 g via INTRAVENOUS
  Filled 2015-05-18: qty 2

## 2015-05-18 MED ORDER — IOHEXOL 300 MG/ML  SOLN
100.0000 mL | Freq: Once | INTRAMUSCULAR | Status: AC | PRN
  Administered 2015-05-18: 100 mL via INTRAVENOUS

## 2015-05-18 MED ORDER — IOHEXOL 300 MG/ML  SOLN
25.0000 mL | Freq: Once | INTRAMUSCULAR | Status: AC | PRN
  Administered 2015-05-18: 25 mL via ORAL

## 2015-05-18 NOTE — Progress Notes (Signed)
Patient listed as having Medicare insurance without pcp.  [facility] provided patient with list of pcps who accept Medicare insurance within a ten mile radius of patient's zip code [phone].  No further EDCM needs at this time.

## 2015-05-18 NOTE — ED Provider Notes (Signed)
CSN: [id number]     Arrival date & time 05/18/15  1908 History   First MD Initiated Contact with Patient 05/18/15 1918     Chief Complaint  Patient presents with  . Urinary Tract Infection     (Consider location/radiation/quality/duration/timing/severity/associated sxs/prior Treatment) HPI Comments: The patient is a 69 year old female, she presents to the hospital after having several days of worsening dizziness and lightheadedness with standing, multiple episodes of vomiting today. She was seen in the hospital one month ago when she was admitted overnight for pyelonephritis and hypotension with an elevated lactic acid. She was treated with Rocephin, discharged with ciprofloxacin and improved however she has been resting for the last month and not getting out of bed very much. At this time she states that she has diffuse abdominal discomfort from all of the vomiting she has been doing. She has been seeing some diarrhea and occasional blood in her stools. She denies headache, fever, swelling, rashes. His symptoms are persistent over the last 2 days and are now severe. She states she cannot stand up without feeling as though she is going to pass out. She was noted to be severely hypotensive on arrival.  Patient is a 69 y.o. female presenting with urinary tract infection. The history is provided by the patient and medical records.  Urinary Tract Infection   Past Medical History  Diagnosis Date  . Hypertension    Past Surgical History  Procedure Laterality Date  . Tubal ligation     History reviewed. No pertinent family history. Social History  Substance Use Topics  . Smoking status: Current Every Day Smoker  . Smokeless tobacco: None  . Alcohol Use: No   OB History    No data available     Review of Systems  All other systems reviewed and are negative.     Allergies  Review of patient's allergies indicates no known allergies.  Home Medications   Prior to Admission  medications   Medication Sig Start Date End Date Taking? Authorizing Provider  naproxen sodium (ANAPROX) 220 MG tablet Take 220-440 mg by mouth 2 (two) times daily as needed (pain).   Yes Historical Provider, MD  loperamide (IMODIUM A-D) 2 MG tablet 2 now and one hourly prn loose stool  Max 8 in 24 hours Patient not taking: Reported on 05/18/2015 04/20/15   Zada Finders, MD  ondansetron (ZOFRAN-ODT) 8 MG disintegrating tablet Take 1 tablet (8 mg total) by mouth every 8 (eight) hours as needed for nausea. Patient not taking: Reported on 05/18/2015 04/20/15   Zada Finders, MD   BP 93/62 mmHg  Pulse 91  Temp(Src) 97.8 F (36.6 C) (Oral)  Resp 18  SpO2 100% Physical Exam  Constitutional: She appears well-developed and well-nourished. She appears distressed.  HENT:  Head: Normocephalic and atraumatic.  Mouth/Throat: No oropharyngeal exudate.  Mucous membranes appear normal, pale conjunctiva and pale mucous membranes  Eyes: EOM are normal. Pupils are equal, round, and reactive to light. Right eye exhibits no discharge. Left eye exhibits no discharge. No scleral icterus.  Neck: Normal range of motion. Neck supple. No JVD present. No thyromegaly present.  Cardiovascular: Regular rhythm, normal heart sounds and intact distal pulses.  Exam reveals no gallop and no friction rub.   No murmur heard. Tachycardic to 105, distant heart sounds, weak pulses at the radial arteries  Pulmonary/Chest: Effort normal and breath sounds normal. No respiratory distress. She has no wheezes. She has no rales.  Abdominal: Soft. Bowel sounds are normal. She  exhibits no distension and no mass. There is tenderness (diffuse mild tenderness without guarding or masses or peritoneal signs).  Musculoskeletal: Normal range of motion. She exhibits no edema or tenderness.  Lymphadenopathy:    She has no cervical adenopathy.  Neurological: She is alert. Coordination normal.  Skin: Skin is warm and dry. No rash noted. No erythema.   Psychiatric: She has a normal mood and affect. Her behavior is normal.  Nursing note and vitals reviewed.   ED Course  Procedures (including critical care time) Labs Review Labs Reviewed  COMPREHENSIVE METABOLIC PANEL - Abnormal; Notable for the following:    Glucose, Bld 111 (*)    BUN 22 (*)    Calcium 10.8 (*)    Total Protein 5.3 (*)    Albumin 2.4 (*)    ALT 8 (*)    GFR calc non Af Amer 58 (*)    All other components within normal limits  CBC WITH DIFFERENTIAL/PLATELET - Abnormal; Notable for the following:    WBC 23.2 (*)    RBC 3.49 (*)    Hemoglobin 10.6 (*)    HCT 32.9 (*)    Neutrophils Relative % 91 (*)    Lymphocytes Relative 3 (*)    Neutro Abs 21.1 (*)    Monocytes Absolute 1.2 (*)    All other components within normal limits  I-STAT CG4 LACTIC ACID, ED - Abnormal; Notable for the following:    Lactic Acid, Venous 2.87 (*)    All other components within normal limits  CULTURE, BLOOD (ROUTINE X 2)  CULTURE, BLOOD (ROUTINE X 2)  URINE CULTURE  URINALYSIS, ROUTINE W REFLEX MICROSCOPIC (NOT AT [facility])  I-STAT CG4 LACTIC ACID, ED    Imaging Review Ct Abdomen Pelvis W Contrast  05/18/2015   CLINICAL DATA:  Hematuria. Patient states ongoing UTI 3 months. Feeling weaker and dizzy today.  EXAM: CT ABDOMEN AND PELVIS WITH CONTRAST  TECHNIQUE: Multidetector CT imaging of the abdomen and pelvis was performed using the standard protocol following bolus administration of intravenous contrast.  CONTRAST:  95mL OMNIPAQUE IOHEXOL 300 MG/ML SOLN, 179mL OMNIPAQUE IOHEXOL 300 MG/ML SOLN  COMPARISON:  Chest CT 04/20/2015  FINDINGS: Atelectasis/scarring over the posterior right lower lobe. Moderate size hiatal hernia unchanged.  Abdominal images demonstrate the liver, spleen, pancreas, gallbladder and adrenal glands to be within normal. Kidneys are normal in size without hydronephrosis or nephrolithiasis. There is a sub cm right renal cortical hypodensity too small to characterize but  likely a cyst. Ureters are within normal. Appendix is normal.  There is minimal calcified plaque over the abdominal aorta. Small lipoma over the second portion of the duodenum.  There is mild diverticulosis of the sigmoid colon. There is wall thickening of the colon from the splenic flexure to the junction of the sigmoid colon in the left lower quadrant compatible with a mild acute colitis. No evidence of perforation. No adjacent free fluid. Mild mucosal enhancement of several small bowel loops in the pelvis which are nondilated. There is mild engorgement of the adjacent vasa recta as findings may be due to regional enteritis. The small bowel findings as well as the colonic findings together could be seen in inflammatory bowel disease such as Crohn's disease.  Pelvic images demonstrate the bladder, uterus, ovaries and rectum to be within normal. There is a heterogeneous abnormal left inguinal lymph node measuring 3.4 cm in diameter. There is a smaller adjacent low-density lymph node in the left inguinal region. There is a low-density rim  enhancing with 2.1 cm left external iliac lymph node. This may be due to metastatic disease in this patient with bilateral pulmonary nodules and pleural effusions. Mild degenerate change of the spine and hips.  IMPRESSION: Colonic wall thickening from the splenic flexure to the junction of the descending colon to sigmoid colon likely representing an acute colitis of infectious or inflammatory nature. Mild mucosal enhancement of several small bowel loops in the pelvis with engorgement of the Vasa recta as findings may be due to a regional enteritis although together with the colonic findings could be seen in inflammatory bowel disease due to Crohn's.  Abnormal left external iliac and inguinal adenopathy suggesting metastatic disease. Recommend clinical correlation and PET scan versus tissue diagnosis via biopsy of this left inguinal node.  Moderate size hiatal hernia unchanged.    Electronically Signed   By: Marin Olp M.D.   On: 05/18/2015 22:25   I have personally reviewed and evaluated these images and lab results as part of my medical decision-making.    MDM   Final diagnoses:  Septic shock  Vaginal mass    The patient's mental status is normal, she is very hypotensive and tachycardic suggesting a shock state. We'll obtain labs, urinalysis, cultures, IV fluid boluses, reevaluate. Her overall picture is critically ill and I suspect she will be admitted to the hospital.  Labs are significantly abnormal with leukocytosis of over 25,000 The renal function is preserved CT scan of the abdomen and pelvis shows colitis as well as a metastatic lesion in the inguinal region Evaluation for urinary source shows in and out catheterization shows a large fungating vaginal mass which smells foul  The patient has had slight improvement in her blood pressure but still appears to be in shock, with her severe leukocytosis, her weakness and orthostasis I suspect that she is in septic shock, broad-spectrum antibiotics for suspected urinary source were given, consultation with the internal medicine resident was obtained and the patient will need to go to a stepdown unit. Critical care provided. Multiple fluid boluses given.  CRITICAL CARE Performed by: Johnna Acosta Total critical care time: 35 Critical care time was exclusive of separately billable procedures and treating other patients. Critical care was necessary to treat or prevent imminent or life-threatening deterioration. Critical care was time spent personally by me on the following activities: development of treatment plan with patient and/or surrogate as well as nursing, discussions with consultants, evaluation of patient's response to treatment, examination of patient, obtaining history from patient or surrogate, ordering and performing treatments and interventions, ordering and review of laboratory studies, ordering and  review of radiographic studies, pulse oximetry and re-evaluation of patient's condition.  Meds given in ED:  Medications  sodium chloride 0.9 % bolus 1,000 mL (1,000 mLs Intravenous New Bag/Given 05/18/15 2113)  iohexol (OMNIPAQUE) 300 MG/ML solution 25 mL (25 mLs Oral Contrast Given 05/18/15 2036)  iohexol (OMNIPAQUE) 300 MG/ML solution 100 mL (100 mLs Intravenous Contrast Given 05/18/15 2151)  cefTRIAXone (ROCEPHIN) 2 g in dextrose 5 % 50 mL IVPB (2 g Intravenous New Bag/Given 05/18/15 2240)       Noemi Chapel, MD 05/18/15 2310

## 2015-05-18 NOTE — ED Notes (Signed)
Bed: RW43 Expected date:  Expected time:  Means of arrival:  Comments: 69 yo F UTI, tachy, hypotensive

## 2015-05-18 NOTE — ED Notes (Signed)
Pt states that she has been trying to get rid of a UTI x 3 months but today is feeling weaker and dizzy. Also notes blood in her urine. Alert and oriented.

## 2015-05-19 ENCOUNTER — Inpatient Hospital Stay (HOSPITAL_COMMUNITY): Payer: Medicare Other

## 2015-05-19 ENCOUNTER — Encounter (HOSPITAL_COMMUNITY): Payer: Self-pay

## 2015-05-19 DIAGNOSIS — N898 Other specified noninflammatory disorders of vagina: Secondary | ICD-10-CM

## 2015-05-19 DIAGNOSIS — R1900 Intra-abdominal and pelvic swelling, mass and lump, unspecified site: Secondary | ICD-10-CM | POA: Diagnosis not present

## 2015-05-19 DIAGNOSIS — A419 Sepsis, unspecified organism: Secondary | ICD-10-CM | POA: Diagnosis present

## 2015-05-19 DIAGNOSIS — R634 Abnormal weight loss: Secondary | ICD-10-CM | POA: Diagnosis present

## 2015-05-19 DIAGNOSIS — R918 Other nonspecific abnormal finding of lung field: Secondary | ICD-10-CM

## 2015-05-19 DIAGNOSIS — N9089 Other specified noninflammatory disorders of vulva and perineum: Secondary | ICD-10-CM | POA: Diagnosis not present

## 2015-05-19 DIAGNOSIS — A09 Infectious gastroenteritis and colitis, unspecified: Secondary | ICD-10-CM

## 2015-05-19 DIAGNOSIS — R5383 Other fatigue: Secondary | ICD-10-CM | POA: Diagnosis present

## 2015-05-19 DIAGNOSIS — R6521 Severe sepsis with septic shock: Secondary | ICD-10-CM | POA: Diagnosis present

## 2015-05-19 DIAGNOSIS — I10 Essential (primary) hypertension: Secondary | ICD-10-CM | POA: Diagnosis present

## 2015-05-19 DIAGNOSIS — N368 Other specified disorders of urethra: Secondary | ICD-10-CM | POA: Diagnosis present

## 2015-05-19 DIAGNOSIS — D72829 Elevated white blood cell count, unspecified: Secondary | ICD-10-CM

## 2015-05-19 DIAGNOSIS — N9489 Other specified conditions associated with female genital organs and menstrual cycle: Secondary | ICD-10-CM | POA: Diagnosis not present

## 2015-05-19 DIAGNOSIS — Z87440 Personal history of urinary (tract) infections: Secondary | ICD-10-CM | POA: Diagnosis not present

## 2015-05-19 DIAGNOSIS — F17211 Nicotine dependence, cigarettes, in remission: Secondary | ICD-10-CM

## 2015-05-19 DIAGNOSIS — R590 Localized enlarged lymph nodes: Secondary | ICD-10-CM

## 2015-05-19 DIAGNOSIS — R197 Diarrhea, unspecified: Secondary | ICD-10-CM | POA: Diagnosis not present

## 2015-05-19 DIAGNOSIS — C519 Malignant neoplasm of vulva, unspecified: Secondary | ICD-10-CM | POA: Diagnosis present

## 2015-05-19 DIAGNOSIS — R6881 Early satiety: Secondary | ICD-10-CM | POA: Diagnosis present

## 2015-05-19 DIAGNOSIS — Z87891 Personal history of nicotine dependence: Secondary | ICD-10-CM | POA: Diagnosis not present

## 2015-05-19 DIAGNOSIS — R630 Anorexia: Secondary | ICD-10-CM | POA: Diagnosis present

## 2015-05-19 DIAGNOSIS — D649 Anemia, unspecified: Secondary | ICD-10-CM | POA: Diagnosis present

## 2015-05-19 DIAGNOSIS — Z6821 Body mass index (BMI) 21.0-21.9, adult: Secondary | ICD-10-CM | POA: Diagnosis not present

## 2015-05-19 LAB — BASIC METABOLIC PANEL
Anion gap: 9 (ref 5–15)
BUN: 14 mg/dL (ref 6–20)
CALCIUM: 9.5 mg/dL (ref 8.9–10.3)
CO2: 23 mmol/L (ref 22–32)
CREATININE: 0.93 mg/dL (ref 0.44–1.00)
Chloride: 105 mmol/L (ref 101–111)
GFR calc non Af Amer: >60 mL/min (ref >60)
GLUCOSE: 70 mg/dL (ref 65–99)
Potassium: 3.4 mmol/L — ABNORMAL LOW (ref 3.5–5.1)
Sodium: 137 mmol/L (ref 135–145)

## 2015-05-19 LAB — MAGNESIUM: Magnesium: 1.6 mg/dL — ABNORMAL LOW (ref 1.7–2.4)

## 2015-05-19 LAB — URINALYSIS, ROUTINE W REFLEX MICROSCOPIC
Glucose, UA: NEGATIVE mg/dL
Ketones, ur: 15 mg/dL — AB
NITRITE: NEGATIVE
PROTEIN: 30 mg/dL — AB
Specific Gravity, Urine: 1.025 (ref 1.005–1.030)
UROBILINOGEN UA: 1 mg/dL (ref 0.0–1.0)
pH: 5.5 (ref 5.0–8.0)

## 2015-05-19 LAB — OCCULT BLOOD X 1 CARD TO LAB, STOOL: FECAL OCCULT BLD: POSITIVE — AB

## 2015-05-19 LAB — URINE MICROSCOPIC-ADD ON

## 2015-05-19 LAB — I-STAT CG4 LACTIC ACID, ED: Lactic Acid, Venous: 1.46 mmol/L (ref 0.5–2.0)

## 2015-05-19 LAB — CBC
HEMATOCRIT: 30.5 % — AB (ref 36.0–46.0)
Hemoglobin: 9.6 g/dL — ABNORMAL LOW (ref 12.0–15.0)
MCH: 29.6 pg (ref 26.0–34.0)
MCHC: 31.5 g/dL (ref 30.0–36.0)
MCV: 94.1 fL (ref 78.0–100.0)
PLATELETS: 296 10*3/uL (ref 150–400)
RBC: 3.24 MIL/uL — ABNORMAL LOW (ref 3.87–5.11)
RDW: 15.2 % (ref 11.5–15.5)
WBC: 18.7 10*3/uL — ABNORMAL HIGH (ref 4.0–10.5)

## 2015-05-19 LAB — HIV ANTIBODY (ROUTINE TESTING W REFLEX): HIV SCREEN 4TH GENERATION: NONREACTIVE

## 2015-05-19 LAB — MRSA PCR SCREENING: MRSA by PCR: NEGATIVE

## 2015-05-19 LAB — APTT: APTT: 32 s (ref 24–37)

## 2015-05-19 LAB — PROTIME-INR
INR: 1.22 (ref 0.00–1.49)
PROTHROMBIN TIME: 15.6 s — AB (ref 11.6–15.2)

## 2015-05-19 MED ORDER — SODIUM CHLORIDE 0.9 % IJ SOLN
3.0000 mL | Freq: Two times a day (BID) | INTRAMUSCULAR | Status: DC
  Administered 2015-05-19 – 2015-05-22 (×6): 3 mL via INTRAVENOUS

## 2015-05-19 MED ORDER — SODIUM CHLORIDE 0.9 % IV BOLUS (SEPSIS): 1000.0000 mL | Freq: Once | INTRAVENOUS | Status: DC

## 2015-05-19 MED ORDER — ONDANSETRON HCL 4 MG/2ML IJ SOLN: 4.0000 mg | Freq: Three times a day (TID) | INTRAMUSCULAR | Status: DC | PRN

## 2015-05-19 MED ORDER — ACETAMINOPHEN 650 MG RE SUPP: 650.0000 mg | Freq: Four times a day (QID) | RECTAL | Status: DC | PRN

## 2015-05-19 MED ORDER — BOOST / RESOURCE BREEZE PO LIQD
1.0000 | Freq: Two times a day (BID) | ORAL | Status: DC
  Administered 2015-05-19 – 2015-05-21 (×4): 1 via ORAL

## 2015-05-19 MED ORDER — DEXTROSE 5 % IV SOLN: 2.0000 g | INTRAVENOUS | Status: DC

## 2015-05-19 MED ORDER — PIPERACILLIN-TAZOBACTAM 3.375 G IVPB
3.3750 g | Freq: Three times a day (TID) | INTRAVENOUS | Status: DC
  Administered 2015-05-19 – 2015-05-21 (×7): 3.375 g via INTRAVENOUS
  Filled 2015-05-19 (×8): qty 50

## 2015-05-19 MED ORDER — ONDANSETRON HCL 4 MG/2ML IJ SOLN
4.0000 mg | Freq: Four times a day (QID) | INTRAMUSCULAR | Status: DC | PRN
Start: 2015-05-19 — End: 2015-05-22
  Administered 2015-05-20 – 2015-05-22 (×3): 4 mg via INTRAVENOUS
  Filled 2015-05-19 (×3): qty 2

## 2015-05-19 MED ORDER — ACETAMINOPHEN 325 MG PO TABS: 650.0000 mg | ORAL_TABLET | Freq: Four times a day (QID) | ORAL | Status: DC | PRN

## 2015-05-19 MED ORDER — POTASSIUM CHLORIDE 10 MEQ/100ML IV SOLN
10.0000 meq | INTRAVENOUS | Status: AC
  Administered 2015-05-19 (×4): 10 meq via INTRAVENOUS
  Filled 2015-05-19: qty 100

## 2015-05-19 MED ORDER — SODIUM CHLORIDE 0.9 % IV SOLN: INTRAVENOUS | Status: DC

## 2015-05-19 MED ORDER — SODIUM CHLORIDE 0.9 % IV BOLUS (SEPSIS)
500.0000 mL | Freq: Once | INTRAVENOUS | Status: AC
  Administered 2015-05-19: 500 mL via INTRAVENOUS

## 2015-05-19 MED ORDER — LACTATED RINGERS IV SOLN
INTRAVENOUS | Status: DC
  Administered 2015-05-19 – 2015-05-21 (×4): via INTRAVENOUS

## 2015-05-19 MED ORDER — VANCOMYCIN 50 MG/ML ORAL SOLUTION
500.0000 mg | Freq: Four times a day (QID) | ORAL | Status: DC
  Administered 2015-05-19 – 2015-05-20 (×6): 500 mg via ORAL
  Filled 2015-05-19 (×9): qty 10

## 2015-05-19 MED ORDER — PIPERACILLIN-TAZOBACTAM 3.375 G IVPB 30 MIN
3.3750 g | Freq: Once | INTRAVENOUS | Status: DC
  Filled 2015-05-19: qty 50

## 2015-05-19 MED ORDER — ENOXAPARIN SODIUM 30 MG/0.3ML ~~LOC~~ SOLN
30.0000 mg | SUBCUTANEOUS | Status: DC
  Administered 2015-05-20 – 2015-05-22 (×3): 30 mg via SUBCUTANEOUS
  Filled 2015-05-19 (×3): qty 0.3

## 2015-05-19 MED ORDER — ENOXAPARIN SODIUM 40 MG/0.4ML ~~LOC~~ SOLN
40.0000 mg | SUBCUTANEOUS | Status: DC
  Filled 2015-05-19: qty 0.4

## 2015-05-19 MED ORDER — ONDANSETRON HCL 4 MG PO TABS: 4.0000 mg | ORAL_TABLET | Freq: Four times a day (QID) | ORAL | Status: DC | PRN

## 2015-05-19 NOTE — H&P (Signed)
Date: 05/19/2015               Patient Name:  Carly Schwartz MRN: [id number]  DOB: [birth year] Age / Sex: 69 y.o., female   PCP: No Pcp Per Patient         Medical Service: Internal Medicine Teaching Service         Attending Physician: Dr. Aldine Contes, MD    First Contact: Dr. Juleen China Pager: [phone]  Second Contact: Dr. Genene Churn Pager: [phone]       After Hours (After 5p/  First Contact Pager: [phone]  weekends / holidays): Second Contact Pager: [phone]   Chief Complaint: fatigue, dehydration  History of Present Illness: [name] is a 69 year old lady with PMH of HTN, presenting with fatigue, weakness, and UTI.  She was recently admitted to [facility] on 8/14, complaining of fatigue, dizziness, and falls.  She was found to be orthostatic and suffering from a UTI.  UA at that time was positive for leukocytes and nitrites and many bacteria.  However, urine culture grew multiple species c/f contamination.  She was given CTX and discharged with 10 day course of Cipro on 8/16.  Today, she reports resolution of her UTI symptoms following completion of Cipro.  She denies dysuria, urgency, frequency, or back pain.  She endorses mild lower abdominal tenderness.  She endorses feeling the same way she did prior to her last admission.  For the last two weeks, she reports decreasing appetite.  She has lost about 18 pounds over the last 2-3 months.  She denies night sweats.  She states food just does not appeal to her and she is not eating.  She reports good PO fluid intake with water. However, for the last two days, she reports N/V and diarrhea.  She states the diarrhea is loose, watery, brown, foul smelling stool.  She says she has about 2 diarrheal BMs per day.  She denies blood in her vomit or stool.  She does reports dizziness with standing.    In the ED, she was hypotensive, with moderate response to IVF boluses.  Her WBC count was elevated to 23, with lactic acid of 2.87.  CT abdomen/pelvis  demonstrated colitis.  She was given one dose CTX IV and 2L NS.    I/O cath for UA sample failed due to large, fungating, foul smelling vaginal mass.   Patient reports first noticing the mass today when being evaluated in the ED.  She denies vaginal bleeding or other discharge, but endorses blood in her urine and when wiping for the last 2-3 months.  She endorses vaginal pain only with manipulation of the mass.  She believes it has been 10+ years since her last Pap smear.  She denies ever having an abnormal Pap smear in the past.  Of note, CT chest on previous admission (8/16) noted multiple bilateral pulmonary nodules c/f malignancy.  PET-CT was recommended but has not been performed.  CT abdomen/pelvis today (9/13) also noted left external and iliac adenopathy c/f metastatic disease.      She denies fever, chills, chest pain, or SOB.  She has a 15 pack-year smoking history, but stopped smoking 3 months ago.   Meds: Current Facility-Administered Medications  Medication Dose Route Frequency Provider Last Rate Last Dose  . 0.9 %  sodium chloride infusion   Intravenous STAT Noemi Chapel, MD      . acetaminophen (TYLENOL) tablet 650 mg  650 mg Oral Q6H PRN Alexa Sherral Hammers,  MD       Or  . acetaminophen (TYLENOL) suppository 650 mg  650 mg Rectal Q6H PRN Alexa Sherral Hammers, MD      . enoxaparin (LOVENOX) injection 40 mg  40 mg Subcutaneous Q24H Alexa Sherral Hammers, MD      . ondansetron [facility]) tablet 4 mg  4 mg Oral Q6H PRN Alexa Sherral Hammers, MD       Or  . ondansetron (ZOFRAN) injection 4 mg  4 mg Intravenous Q6H PRN Alexa Sherral Hammers, MD      . sodium chloride 0.9 % bolus 1,000 mL  1,000 mL Intravenous Once Alexa Sherral Hammers, MD      . sodium chloride 0.9 % injection 3 mL  3 mL Intravenous Q12H Alexa Sherral Hammers, MD        Allergies: Allergies as of 05/18/2015  . (No Known Allergies)   Past Medical History  Diagnosis Date  . Hypertension    Past Surgical History  Procedure  Laterality Date  . Tubal ligation     History reviewed. No pertinent family history. Social History   Social History  . Marital Status: Single    Spouse Name: N/A  . Number of Children: N/A  . Years of Education: N/A   Occupational History  . Not on file.   Social History Main Topics  . Smoking status: Current Every Day Smoker  . Smokeless tobacco: Not on file  . Alcohol Use: No  . Drug Use: No  . Sexual Activity: Not on file   Other Topics Concern  . Not on file   Social History Narrative    Review of Systems: Pertinent items are noted in HPI.  Physical Exam: Blood pressure 85/52, pulse 92, temperature 97.8 F (36.6 C), temperature source Oral, resp. rate 21, SpO2 98 %. Physical Exam  Constitutional: She is oriented to person, place, and time. No distress.  Frail, appears older than stated age. Lying in bed in NAD  HENT:  Head: Normocephalic and atraumatic.  Mouth/Throat: Oropharynx is clear and moist.  Eyes: EOM are normal. No scleral icterus.  Neck: No tracheal deviation present.  Cardiovascular: Normal rate, regular rhythm, normal heart sounds and intact distal pulses.   Heart sounds distant.  Cap refill <2 seconds  Pulmonary/Chest: Effort normal. No respiratory distress.  Scattered wheezes.  Minimal crackles in bilateral bases.  Abdominal: Soft. She exhibits no distension. There is no tenderness. There is no rebound and no guarding.  Genitourinary:  6x4 cm fungating, verrucous, pink, mass, appears to be protruding from vagina.  Tender to manipulation. No odor appreciated.  Palpable, 3 cm, hard, left inguinal node.  Musculoskeletal: She exhibits no edema.  Neurological: She is alert and oriented to person, place, and time.  Skin: Skin is warm and dry. She is not diaphoretic.     Lab results: Basic Metabolic Panel:  Recent Labs  05/18/15 1940  NA 139  K 4.0  CL 101  CO2 26  GLUCOSE 111*  BUN 22*  CREATININE 0.97  CALCIUM 10.8*   Liver Function  Tests:  Recent Labs  05/18/15 1940  AST 21  ALT 8*  ALKPHOS 63  BILITOT 1.0  PROT 5.3*  ALBUMIN 2.4*   No results for input(s): LIPASE, AMYLASE in the last 72 hours. No results for input(s): AMMONIA in the last 72 hours. CBC:  Recent Labs  05/18/15 1940  WBC 23.2*  NEUTROABS 21.1*  HGB 10.6*  HCT 32.9*  MCV 94.3  PLT 286  Cardiac Enzymes: No results for input(s): CKTOTAL, CKMB, CKMBINDEX, TROPONINI in the last 72 hours. BNP: No results for input(s): PROBNP in the last 72 hours. D-Dimer: No results for input(s): DDIMER in the last 72 hours. CBG: No results for input(s): GLUCAP in the last 72 hours. Hemoglobin A1C: No results for input(s): HGBA1C in the last 72 hours. Fasting Lipid Panel: No results for input(s): CHOL, HDL, LDLCALC, TRIG, CHOLHDL, LDLDIRECT in the last 72 hours. Thyroid Function Tests: No results for input(s): TSH, T4TOTAL, FREET4, T3FREE, THYROIDAB in the last 72 hours. Anemia Panel: No results for input(s): VITAMINB12, FOLATE, FERRITIN, TIBC, IRON, RETICCTPCT in the last 72 hours. Coagulation: No results for input(s): LABPROT, INR in the last 72 hours. Urine Drug Screen: Drugs of Abuse  No results found for: LABOPIA, COCAINSCRNUR, LABBENZ, AMPHETMU, THCU, LABBARB  Alcohol Level: No results for input(s): ETH in the last 72 hours. Urinalysis: No results for input(s): COLORURINE, LABSPEC, PHURINE, GLUCOSEU, HGBUR, BILIRUBINUR, KETONESUR, PROTEINUR, UROBILINOGEN, NITRITE, LEUKOCYTESUR in the last 72 hours.  Invalid input(s): APPERANCEUR  Imaging results:  Ct Abdomen Pelvis W Contrast  05/18/2015   CLINICAL DATA:  Hematuria. Patient states ongoing UTI 3 months. Feeling weaker and dizzy today.  EXAM: CT ABDOMEN AND PELVIS WITH CONTRAST  TECHNIQUE: Multidetector CT imaging of the abdomen and pelvis was performed using the standard protocol following bolus administration of intravenous contrast.  CONTRAST:  46mL OMNIPAQUE IOHEXOL 300 MG/ML SOLN, 112mL  OMNIPAQUE IOHEXOL 300 MG/ML SOLN  COMPARISON:  Chest CT 04/20/2015  FINDINGS: Atelectasis/scarring over the posterior right lower lobe. Moderate size hiatal hernia unchanged.  Abdominal images demonstrate the liver, spleen, pancreas, gallbladder and adrenal glands to be within normal. Kidneys are normal in size without hydronephrosis or nephrolithiasis. There is a sub cm right renal cortical hypodensity too small to characterize but likely a cyst. Ureters are within normal. Appendix is normal.  There is minimal calcified plaque over the abdominal aorta. Small lipoma over the second portion of the duodenum.  There is mild diverticulosis of the sigmoid colon. There is wall thickening of the colon from the splenic flexure to the junction of the sigmoid colon in the left lower quadrant compatible with a mild acute colitis. No evidence of perforation. No adjacent free fluid. Mild mucosal enhancement of several small bowel loops in the pelvis which are nondilated. There is mild engorgement of the adjacent vasa recta as findings may be due to regional enteritis. The small bowel findings as well as the colonic findings together could be seen in inflammatory bowel disease such as Crohn's disease.  Pelvic images demonstrate the bladder, uterus, ovaries and rectum to be within normal. There is a heterogeneous abnormal left inguinal lymph node measuring 3.4 cm in diameter. There is a smaller adjacent low-density lymph node in the left inguinal region. There is a low-density rim enhancing with 2.1 cm left external iliac lymph node. This may be due to metastatic disease in this patient with bilateral pulmonary nodules and pleural effusions. Mild degenerate change of the spine and hips.  IMPRESSION: Colonic wall thickening from the splenic flexure to the junction of the descending colon to sigmoid colon likely representing an acute colitis of infectious or inflammatory nature. Mild mucosal enhancement of several small bowel loops  in the pelvis with engorgement of the Vasa recta as findings may be due to a regional enteritis although together with the colonic findings could be seen in inflammatory bowel disease due to Crohn's.  Abnormal left external iliac and inguinal adenopathy  suggesting metastatic disease. Recommend clinical correlation and PET scan versus tissue diagnosis via biopsy of this left inguinal node.  Moderate size hiatal hernia unchanged.   Electronically Signed   By: Marin Olp M.D.   On: 05/18/2015 22:25    Other results: EKG:   Assessment & Plan by Problem: Active Problems:   Shock   Sepsis  [name] is a 69 year old lady with PMH of HTN, presenting with sepsis and vaginal mass.  Severe sepsis 2/2 UTI: Patient 2/4 SIRS and hypotensive, with adequate response to IVF resuscitation.  Recently admitted to UTI.  Patient responded to CTX and Cipro, though no bug isolated; therefore, no sensitivities.  Current UA pending, though she reports it feels like prior admission with symptoms recurring.  Etiology of recurrent UTI likely 2/2 urethral obstruction from vaginal mass.  Other source of possible infection is diarrhea and colitis seen on CT.  She has risk factors for C diff given recent admission and antibiotic use.  Patient s/p 2L NS and one dose IV CTX in ED.  Will continue CTX as long as UA c/w UTI.  If UA negative, will broaden coverage to include GI pathogens. - CTX per pharmacy - UA - C diff PCR - IVF: 1L NS bolus and LR 125 cc/hr  Vaginal Mass: Large, fungating mass protruding from vagina.  Decreased appetite and 18 lb weight loss. Will perform speculum exam when instruments available.  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   - Pelvic exam - Vaginal swab for cytology and HPV - Surgical consult for bx - F/u with PET scan  Diarrhea: Evidence of colitis on CT.  Unclear etiology, but must consider C diff given recent admission  and antibiotics.  Will broaden coverage if C diff positive or UA negative for UTI. - C diff PCR - FOBT  HTN: Stable. No home meds.  FEN/GI - Regular diet - Nutrition consult  DVT Ppx: Lovenox  Dispo: Disposition is deferred at this time, awaiting improvement of current medical problems.   The patient does have a current PCP (Dr. Zada Finders) and does need an [facility] hospital follow-up appointment after discharge.  The patient does not have transportation limitations that hinder transportation to clinic appointments.  Signed: Iline Oven, MD, PhD 05/19/2015, 4:36 AM

## 2015-05-19 NOTE — Progress Notes (Signed)
Subjective: Patient admitted overnight and seen this morning on rounds.  Doing well overnight, no new complaints.  States her diarrhea has currently resolved.  States has some mild abdominal pain.   Objective: Vital signs in last 24 hours: Filed Vitals:   05/19/15 0632 05/19/15 0715 05/19/15 1104 05/19/15 1200  BP:  80/46 94/47 91/59   Pulse:  89 91 94  Temp: 98.5 F (36.9 C) 97.8 F (36.6 C)    TempSrc: Oral Oral    Resp:  22 17 13   Height:      Weight:      SpO2:  96% 97% 95%   Weight change:   Intake/Output Summary (Last 24 hours) at 05/19/15 1523 Last data filed at 05/19/15 1059  Gross per 24 hour  Intake 299.17 ml  Output    300 ml  Net  -0.83 ml   General: resting in bed, no distress, frail appearing HEENT: EOMI, no scleral icterus Cardiac: RRR, no rubs, murmurs or gallops Pulm: clear to auscultation bilaterally, moving normal volumes of air Abd: soft, mildly tender to palpation, nondistended, BS present Ext: warm and well perfused, no pedal edema Neuro: alert and oriented X3, cranial nerves II-XII grossly intact  Lab Results: Basic Metabolic Panel:  Recent Labs Lab 05/18/15 1940 05/19/15 0510 05/19/15 1157  NA 139 137  --   K 4.0 3.4*  --   CL 101 105  --   CO2 26 23  --   GLUCOSE 111* 70  --   BUN 22* 14  --   CREATININE 0.97 0.93  --   CALCIUM 10.8* 9.5  --   MG  --   --  1.6*   Liver Function Tests:  Recent Labs Lab 05/18/15 1940  AST 21  ALT 8*  ALKPHOS 63  BILITOT 1.0  PROT 5.3*  ALBUMIN 2.4*   CBC:  Recent Labs Lab 05/18/15 1940 05/19/15 0510  WBC 23.2* 18.7*  NEUTROABS 21.1*  --   HGB 10.6* 9.6*  HCT 32.9* 30.5*  MCV 94.3 94.1  PLT 286 296   Coagulation:  Recent Labs Lab 05/19/15 0510  LABPROT 15.6*  INR 1.22   Urinalysis:  Recent Labs Lab 05/19/15 0410  COLORURINE YELLOW  LABSPEC 1.025  PHURINE 5.5  GLUCOSEU NEGATIVE  HGBUR LARGE*  BILIRUBINUR SMALL*  KETONESUR 15*  PROTEINUR 30*  UROBILINOGEN 1.0    NITRITE NEGATIVE  LEUKOCYTESUR SMALL*    Micro Results: Recent Results (from the past 240 hour(s))  Blood Culture (routine x 2)     Status: None (Preliminary result)   Collection Time: 05/18/15  7:30 PM  Result Value Ref Range Status   Specimen Description   Final    BLOOD LEFT FOREARM Performed at [facility]    Special Requests BOTTLES DRAWN AEROBIC AND ANAEROBIC 3 ML  Final   Culture PENDING  Incomplete   Report Status PENDING  Incomplete  MRSA PCR Screening     Status: None   Collection Time: 05/19/15  6:22 AM  Result Value Ref Range Status   MRSA by PCR NEGATIVE NEGATIVE Final    Comment:        The GeneXpert MRSA Assay (FDA approved for NASAL specimens only), is one component of a comprehensive MRSA colonization surveillance program. It is not intended to diagnose MRSA infection nor to guide or monitor treatment for MRSA infections.    Studies/Results: Ct Abdomen Pelvis W Contrast  05/18/2015   CLINICAL DATA:  Hematuria. Patient states ongoing UTI 3  months. Feeling weaker and dizzy today.  EXAM: CT ABDOMEN AND PELVIS WITH CONTRAST  TECHNIQUE: Multidetector CT imaging of the abdomen and pelvis was performed using the standard protocol following bolus administration of intravenous contrast.  CONTRAST:  59mL OMNIPAQUE IOHEXOL 300 MG/ML SOLN, 179mL OMNIPAQUE IOHEXOL 300 MG/ML SOLN  COMPARISON:  Chest CT 04/20/2015  FINDINGS: Atelectasis/scarring over the posterior right lower lobe. Moderate size hiatal hernia unchanged.  Abdominal images demonstrate the liver, spleen, pancreas, gallbladder and adrenal glands to be within normal. Kidneys are normal in size without hydronephrosis or nephrolithiasis. There is a sub cm right renal cortical hypodensity too small to characterize but likely a cyst. Ureters are within normal. Appendix is normal.  There is minimal calcified plaque over the abdominal aorta. Small lipoma over the second portion of the duodenum.  There is mild  diverticulosis of the sigmoid colon. There is wall thickening of the colon from the splenic flexure to the junction of the sigmoid colon in the left lower quadrant compatible with a mild acute colitis. No evidence of perforation. No adjacent free fluid. Mild mucosal enhancement of several small bowel loops in the pelvis which are nondilated. There is mild engorgement of the adjacent vasa recta as findings may be due to regional enteritis. The small bowel findings as well as the colonic findings together could be seen in inflammatory bowel disease such as Crohn's disease.  Pelvic images demonstrate the bladder, uterus, ovaries and rectum to be within normal. There is a heterogeneous abnormal left inguinal lymph node measuring 3.4 cm in diameter. There is a smaller adjacent low-density lymph node in the left inguinal region. There is a low-density rim enhancing with 2.1 cm left external iliac lymph node. This may be due to metastatic disease in this patient with bilateral pulmonary nodules and pleural effusions. Mild degenerate change of the spine and hips.  IMPRESSION: Colonic wall thickening from the splenic flexure to the junction of the descending colon to sigmoid colon likely representing an acute colitis of infectious or inflammatory nature. Mild mucosal enhancement of several small bowel loops in the pelvis with engorgement of the Vasa recta as findings may be due to a regional enteritis although together with the colonic findings could be seen in inflammatory bowel disease due to Crohn's.  Abnormal left external iliac and inguinal adenopathy suggesting metastatic disease. Recommend clinical correlation and PET scan versus tissue diagnosis via biopsy of this left inguinal node.  Moderate size hiatal hernia unchanged.   Electronically Signed   By: Marin Olp M.D.   On: 05/18/2015 22:25   Dg Chest Port 1 View  05/19/2015   CLINICAL DATA:  Sepsis.  EXAM: PORTABLE CHEST - 1 VIEW  COMPARISON:  Chest CT  04/20/2015  FINDINGS: Left upper lobe pulmonary nodule is less well-defined than on prior exam. Additional pulmonary nodules on prior CT are not definitively seen. Heart size and mediastinal contours are unchanged. No confluent airspace disease to suggest pneumonia. No pleural effusion or pneumothorax.  IMPRESSION: 1.  No acute pulmonary process. 2. Left upper lobe pulmonary nodule, as seen on recent chest CT. Additional pulmonary nodules on CT are not seen radiographically.   Electronically Signed   By: Jeb Levering M.D.   On: 05/19/2015 05:38   Medications: Scheduled Meds: . [START ON 05/20/2015] enoxaparin (LOVENOX) injection  30 mg Subcutaneous Q24H  . feeding supplement  1 Container Oral BID BM  . piperacillin-tazobactam (ZOSYN)  IV  3.375 g Intravenous Q8H  . potassium chloride  10  mEq Intravenous Q1 Hr x 4  . sodium chloride  1,000 mL Intravenous Once  . sodium chloride  3 mL Intravenous Q12H  . vancomycin  500 mg Oral 4 times per day   Continuous Infusions: . lactated ringers 125 mL/hr at 05/19/15 0743   PRN Meds:.acetaminophen **OR** acetaminophen, ondansetron **OR** ondansetron (ZOFRAN) IV Assessment/Plan: Active Problems:   Shock   Sepsis  Ms. Isabela Nardelli is a 69 year old lady with PMH of HTN, presenting with sepsis and vaginal mass.  Severe sepsis: on admission patient had 2/4 SIRS criteria and is hypotensive, with adequate response to IV fluids.  She was recently admitted for UTI and treated with ceftriaxone and ciprofloxacin (no bug isolated).  Initially concern for recurrent UTI in the setting of urethral obstruction from vaginal mass.  However, UA at admission with small amount leukocytes and negative nitrites.  Culture pending. CT scan results with colonic wall thickening from the splenic flexure to the junction of the descending colon to sigmoid colon likely representing an acute colitis of infectious or inflammatory nature.  Concern for C. Diff given recent antibiotic  use -stop ceftriaxone -continue Zosyn per pharmacy -vancomycin 500mg  PO q6h x 14 until C. Diff results -given 500cc bolus this morning with appropriate response.  Goal MAP >65.  Continue to monitor, bolus as needed. -follow up C. Diff PCR -Lactated ringers @125  ml/hr -lactic acid 2.87 >> 1.46 with IV fluids  -WBC trend: 23.2  >> 18.7 -blood cultures x 2 pending -urinalysis without evidence of UTI.  Urine culture pending  Vaginal Mass: Large, fungating mass protruding from vagina. Decreased appetite and 18 lb weight loss.  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 Follow up their recs  Diarrhea: Evidence of colitis on CT. Unclear etiology, but concern for C. Diff with recent antibiotic use -resolved as of this morning -FOBT positive -follow up C. Diff PCR -vancomycin PO  HTN: Stable. No home meds.  Dispo: Disposition is deferred at this time, awaiting improvement of current medical problems.   The patient does not have a current PCP (No Pcp Per Patient) and does need an [facility] hospital follow-up appointment after discharge.  The patient does not have transportation limitations that hinder transportation to clinic appointments.    LOS: 0 days   Jule Ser, DO 05/19/2015, 3:23 PM

## 2015-05-19 NOTE — Progress Notes (Signed)
Initial Nutrition Assessment  DOCUMENTATION CODES:   Severe malnutrition in context of chronic illness  INTERVENTION:    Boost Breeze po BID, each supplement provides 250 kcal and 9 grams of protein  NUTRITION DIAGNOSIS:   Malnutrition related to chronic illness as evidenced by percent weight loss, severe depletion of muscle mass (11% weight loss within 3 months).  GOAL:   Patient will meet greater than or equal to 90% of their needs  MONITOR:   PO intake, Supplement acceptance, Labs, Weight trends  REASON FOR ASSESSMENT:   Consult Assessment of nutrition requirement/status  ASSESSMENT:   69 year old lady with PMH of HTN, presenting with fatigue, weakness, and UTI. Recent admission to [facility] on 8/14 with orthostasis and UTI.CT abdomen/pelvis demonstrated colitis. Also found to have a large, fungating vaginal mass.  Work-up ongoing. Labs reviewed: potassium is low. Nutrition-Focused physical exam completed. Findings are mild-moderate fat depletion, severe muscle depletion, and no edema.   Patient reports that she has lost a lot of weight over the past 3 months. Per her report of usual weight 129 lbs, she has lost 11% of usual weight in the past 3 months, which is significant. She reports poor appetite, no pain when eating, but is scared to eat anything, "it's all in my head." She does not want anything that resembles milk. Agreed to try Boost Breeze clear liquid supplement to maximize oral intake.   Diet Order:  Diet regular Room service appropriate?: Yes; Fluid consistency:: Thin  Skin:  Reviewed, no issues  Last BM:  9/13  Height:   Ht Readings from Last 1 Encounters:  05/19/15 5\' 2"  (1.575 m)    Weight:   Wt Readings from Last 1 Encounters:  05/19/15 115 lb 15.4 oz (52.6 kg)    Ideal Body Weight:  50 kg  BMI:  Body mass index is 21.2 kg/(m^2).  Estimated Nutritional Needs:   Kcal:  1500-1700  Protein:  75-85 gm  Fluid:  1.5-1.7 L  EDUCATION NEEDS:    No education needs identified at this time  Molli Barrows, [place], [place], [place] Pager [phone] After Hours Pager [phone]

## 2015-05-19 NOTE — ED Notes (Signed)
[facility] arrived to transport patient.  

## 2015-05-19 NOTE — Progress Notes (Signed)
New pt arrived via [facility] on stretcher. In bed with asst of staff. Alert and oriented x4. Verbalizes needs without difficulty. No family at bedside. Pleasant and cooperative. MD at bedside.

## 2015-05-19 NOTE — ED Notes (Signed)
3S RN was given report. [facility] called for transport. ETA 1 hr. Patient notified of room and of eta.

## 2015-05-19 NOTE — Progress Notes (Addendum)
ANTIBIOTIC CONSULT NOTE - INITIAL  Pharmacy Consult for Zosyn Indication: intra-abdominal infection  No Known Allergies  Estimated Creatinine Clearance: 43.3 mL/min (by C-G formula based on Cr of 0.97).    Assessment/Plan:  69yo female c/o fatigue, weakness, and UTI, recently responded well to Rocephin and started Rocephin on admission, CT concerning for acute colitis of infectious or inflammatory nature, to change ABX.  Will start Zosyn 3.375g IV Q8H and monitor CBC and Cx.  Wynona Neat, PharmD, BCPS  05/19/2015,5:22 AM   Pharmacy to sign off and follow peripherally for renal changes  Thank you Anette Guarneri, PharmD [phone]

## 2015-05-19 NOTE — Consult Note (Signed)
Gynecologic Oncology Consultation  HPI:  Carly Schwartz is a 68 year old female who presented to the ED with fatigue, weakness, dizziness, difficulty urinating, diarrhea, and recent UTI.  She was admitted for sepsis, questionable recurrent UTI, colitis on CT with WBC 23.2.  During this admission, she was also noted to have a fungating vaginal mass when an I&O cath attempt was unsuccessful due to obstruction from the mass.  CT scan on 9/13 resulting:Colonic wall thickening from the splenic flexure to the junction of the descending colon to sigmoid colon likely representing an acute colitis of infectious or inflammatory nature. Mild mucosal enhancement of several small bowel loops in the pelvis with engorgement of the vasa recta as findings may be due to a regional enteritis although together with the colonic findings could be seen in inflammatory bowel disease due to Crohn's.  Abnormal left external iliac and inguinal adenopathy suggesting metastatic disease. Recommend clinical correlation and PET scan versus tissue diagnosis via biopsy of this left inguinal node.  Moderate size hiatal hernia unchanged.  Based on the results, there was a concern for c diff due to recent cipro use and colitis seen on scan.  During her previous admission in August 2016, CT of the chest noted several pulmonary nodules stating metastatic disease cannot be excluded.  PET scan was recommended but has not been completed at this time.   She states she did not know the vulvar mass was present until it was noted on this admission.  She was having difficulty urinating when she came to the hospital but states her bladder was "doing good" before August.  The vulvar mass had "not been a bother at home" and she did not notice it.  She reports having light vaginal spotting for several months but denies having a foul vaginal odor until this admission.  She reports having loose bowel movements for several days prior to this admission with no blood  in her stool but states her bowel movements were normal at home prior to the episodes of loose stools.  She has not had a BM since admission and is currently on isolation to rule out c diff.  She had some abdominal discomfort when she first became ill in August but denies discomfort at this time.  She also states she cannot feel the mass on her vulva with resting or with ambulation.  She has been experiencing a decreased appetite x 1 week with dry heaves as well.  Positive for early satiety.  She reports her weight as 129 lb when she first became ill in August, 113 lb yesterday, and 115 lb today.        Her gynecologic history includes G5 P5.  She states she saw a gynecologist "maybe 5 years ago" with her pap smear being at least ten years ago.  Her children were all born in a hospital vaginally but she denies having prenatal care.  No history of abnormal pap smears to her knowledge.  Denies use of BCPs or hormone therapy.  She denies a history of fibroids or endometriosis.  She had a tubal ligation in the past and denies any other surgeries.  She reports having a mammogram 5 to 6 years ago and denies ever having a colonoscopy.       Review of Systems  Constitutional: Positive for early satiety and decreased appetite.  Unsure if dizziness/weakness has resolved since she has not been out of bed.  Cardiovascular: No chest pain, shortness of breath, or edema.  Pulmonary:  No cough or wheeze. No hemoptysis.    Gastrointestinal: No nausea, vomiting, or diarrhea at this time. Loose stools noted prior to admission.  No bright red blood per rectum or change in bowel movement.  Genitourinary: No frequency, urgency, or dysuria. Positive for vaginal spotting and discharge.  Musculoskeletal: No myalgia or joint pain. Neurologic: Positive for weakness on admission.  No numbness or change in gait.  Psychology: No depression, anxiety, or insomnia.  Current Meds:  Current facility-administered medications:  .   acetaminophen (TYLENOL) tablet 650 mg, 650 mg, Oral, Q6H PRN **OR** acetaminophen (TYLENOL) suppository 650 mg, 650 mg, Rectal, Q6H PRN, Alexa Sherral Hammers, MD .  Derrill Memo ON 05/20/2015] enoxaparin (LOVENOX) injection 30 mg, 30 mg, Subcutaneous, Q24H, Nischal Narendra, MD .  feeding supplement (BOOST / RESOURCE BREEZE) liquid 1 Container, 1 Container, Oral, BID BM, Ardeen Garland, RD, 1 Container at 05/19/15 1030 .  lactated ringers infusion, , Intravenous, Continuous, Alexa Sherral Hammers, MD, Last Rate: 125 mL/hr at 05/19/15 0743 .  ondansetron (ZOFRAN) tablet 4 mg, 4 mg, Oral, Q6H PRN **OR** ondansetron (ZOFRAN) injection 4 mg, 4 mg, Intravenous, Q6H PRN, Alexa Sherral Hammers, MD .  piperacillin-tazobactam (ZOSYN) IVPB 3.375 g, 3.375 g, Intravenous, Q8H, Veronda P Bryk, RPH, 3.375 g at 05/19/15 1321 .  sodium chloride 0.9 % bolus 1,000 mL, 1,000 mL, Intravenous, Once, Alexa Sherral Hammers, MD, 1,000 mL at 05/19/15 0415 .  sodium chloride 0.9 % injection 3 mL, 3 mL, Intravenous, Q12H, Alexa Sherral Hammers, MD, 0 mL at 05/19/15 1000 .  vancomycin (VANCOCIN) 50 mg/mL oral solution 500 mg, 500 mg, Oral, 4 times per day, Alexa Sherral Hammers, MD, 500 mg at 05/19/15 1321  Allergy: No Known Allergies  Social Hx:   Social History   Social History  . Marital Status: Single    Spouse Name: N/A  . Number of Children: N/A  . Years of Education: N/A   Occupational History  . Not on file.   Social History Main Topics  . Smoking status: Former Smoker -- 1.00 packs/day for 15 years    Types: Cigarettes    Quit date: 02/16/2015  . Smokeless tobacco: Not on file  . Alcohol Use: No  . Drug Use: No  . Sexual Activity: No   Other Topics Concern  . Not on file   Social History Narrative  She had smoked one pack a day for 16 years and recently stopped three months ago.        Past Surgical Hx:  Past Surgical History  Procedure Laterality Date  . Tubal ligation      Past Medical Hx:  Past Medical  History  Diagnosis Date  . Hypertension     Family Hx: She had 3 daughters, 2 living, and two sons.  Her oldest daughter passed away for Hodgkins lymphoma and was diagnosed at 66.  Her mother had ovarian cancer and passed away when the patient was in high school.  Her father passed away from coal mine dust exposure.  She had two brothers with the oldest deceased from "his ex wife tearing him apart" and her younger brother currently lives in [place].    Vitals:  Blood pressure 111/75, pulse 95, temperature 97.5 F (36.4 C), temperature source Oral, resp. rate 16, height 5\' 2"  (1.575 m), weight 115 lb 15.4 oz (52.6 kg), SpO2 98 %.  Physical Exam:  General: Frail female in no acute distress. Alert and oriented x 3.  Cardiovascular: Regular rate and rhythm.  S1 and S2 normal.  Lungs: Clear to auscultation bilaterally. No wheezes/crackles/rhonchi noted.  Skin: No rashes or lesions present except for vulvar mass.  Abdomen: Abdomen soft, non-tender and non-obese. Active bowel sounds in all quadrants. No evidence of a fluid wave or abdominal masses.  Genitourinary:    Vulva/vagina: Large, fungating vulvar mass measuring around 9 cm extending from above the urethra to posterior to the anus.  Easily friable and firm.  Mass obstructing the view of the anus.  Tender with manipulation.  2.5cm lesion noted on the left labia majora as well.      Urethra: Urethra visualized behind portion of the mass with no lesions or masses involving the urethra.    Vagina: One finger inserted in the vagina.  Nodularity noted around the introitus and lower portion of the vaginal tissues.  No nodularity noted at the top of the vagina.  Cervix smooth and midline.  No cervical motion tenderness.  Uterus mobile.  Exam ended due to moderate discomfort.  Palpable left inguinal node.  Rectal: Unable to identify anus for rectal exam due to mass and pain with manipulation.  Extremities: No bilateral cyanosis, edema, or clubbing.   Soft tissue ultrasound being performed upon entry into the patient's room.    Assessment/Plan:  69 year old currently admitted for admitted for sepsis, colitis, rule out UTI/c diff with large fungating vulvar mass.  Vulvar mass concerning for malignancy with lymphadenopathy present on CT.  Patient to be seen tomorrow by Dr. Everitt Amber, GYN Oncologist for evaluation and possible biopsy of the vulvar mass.  PET scan recommended to evaluate for metastatic disease and evaluate pulmonary nodules seen on CT chest in August per Dr. Denman George. No need to hold lovenox per Dr. Denman George.      CROSS, MELISSA DEAL, NP 05/19/2015, 5:07 PM  Office [phone] Pager [phone]

## 2015-05-19 NOTE — Progress Notes (Signed)
ANTIBIOTIC CONSULT NOTE - INITIAL  Pharmacy Consult for Ceftriaxone Indication: urosepsis  No Known Allergies  Patient Measurements:   Adjusted Body Weight:   Vital Signs: Temp: 97.8 F (36.6 C) (09/13 2250) Temp Source: Oral (09/13 2250) BP: 85/52 mmHg (09/14 0313) Pulse Rate: 92 (09/14 0313) Intake/Output from previous day:   Intake/Output from this shift:    Labs:  Recent Labs  05/18/15 1940  WBC 23.2*  HGB 10.6*  PLT 286  CREATININE 0.97   CrCl cannot be calculated (Unknown ideal weight.). No results for input(s): VANCOTROUGH, VANCOPEAK, VANCORANDOM, GENTTROUGH, GENTPEAK, GENTRANDOM, TOBRATROUGH, TOBRAPEAK, TOBRARND, AMIKACINPEAK, AMIKACINTROU, AMIKACIN in the last 72 hours.   Microbiology: Recent Results (from the past 720 hour(s))  Blood Culture (routine x 2)     Status: None (Preliminary result)   Collection Time: 05/18/15  7:30 PM  Result Value Ref Range Status   Specimen Description   Final    BLOOD LEFT FOREARM Performed at [facility]    Special Requests BOTTLES DRAWN AEROBIC AND ANAEROBIC 3 ML  Final   Culture PENDING  Incomplete   Report Status PENDING  Incomplete    Medical History: Past Medical History  Diagnosis Date  . Hypertension     Medications:  Prescriptions prior to admission  Medication Sig Dispense Refill Last Dose  . [DISCONTINUED] naproxen sodium (ANAPROX) 220 MG tablet Take 220-440 mg by mouth 2 (two) times daily as needed (pain).   a month at Unknown time  . [DISCONTINUED] loperamide (IMODIUM A-D) 2 MG tablet 2 now and one hourly prn loose stool  Max 8 in 24 hours (Patient not taking: Reported on 05/18/2015) 30 tablet 0   . [DISCONTINUED] ondansetron (ZOFRAN-ODT) 8 MG disintegrating tablet Take 1 tablet (8 mg total) by mouth every 8 (eight) hours as needed for nausea. (Patient not taking: Reported on 05/18/2015) 12 tablet 0    Scheduled:  . sodium chloride   Intravenous STAT  . cefTRIAXone (ROCEPHIN)  IV  2 g  Intravenous Q24H  . enoxaparin (LOVENOX) injection  40 mg Subcutaneous Q24H  . sodium chloride  1,000 mL Intravenous Once  . sodium chloride  3 mL Intravenous Q12H   Infusions:     Assessment: 69yo female with history of HTN presents with worsening dizziness, lightheadedness and vomiting. Pharmacy is consulted to dose ceftriaxone for possible urosepsis. Pt is afebrile, WBC 23.2, sCr 0.97, LA 2.87.  Goal of Therapy:  Eradication of infection  Plan:  Ceftriaxone 2g IV q24h Follow up culture results, renal function and clinical course  Andrey Cota. Diona Foley, PharmD Clinical Pharmacist Pager [phone] 05/19/2015,4:41 AM

## 2015-05-19 NOTE — Progress Notes (Signed)
Utilization review completed.  

## 2015-05-20 DIAGNOSIS — N9489 Other specified conditions associated with female genital organs and menstrual cycle: Secondary | ICD-10-CM

## 2015-05-20 DIAGNOSIS — N898 Other specified noninflammatory disorders of vagina: Secondary | ICD-10-CM | POA: Insufficient documentation

## 2015-05-20 LAB — BASIC METABOLIC PANEL
Anion gap: 8 (ref 5–15)
BUN: 6 mg/dL (ref 6–20)
CHLORIDE: 102 mmol/L (ref 101–111)
CO2: 24 mmol/L (ref 22–32)
CREATININE: 0.81 mg/dL (ref 0.44–1.00)
Calcium: 9.2 mg/dL (ref 8.9–10.3)
GFR calc Af Amer: >60 mL/min (ref >60)
GFR calc non Af Amer: >60 mL/min (ref >60)
Glucose, Bld: 80 mg/dL (ref 65–99)
POTASSIUM: 3.6 mmol/L (ref 3.5–5.1)
SODIUM: 134 mmol/L — AB (ref 135–145)

## 2015-05-20 LAB — CBC
HEMATOCRIT: 27.7 % — AB (ref 36.0–46.0)
HEMOGLOBIN: 8.8 g/dL — AB (ref 12.0–15.0)
MCH: 29.9 pg (ref 26.0–34.0)
MCHC: 31.8 g/dL (ref 30.0–36.0)
MCV: 94.2 fL (ref 78.0–100.0)
Platelets: 235 10*3/uL (ref 150–400)
RBC: 2.94 MIL/uL — AB (ref 3.87–5.11)
RDW: 15.4 % (ref 11.5–15.5)
WBC: 13.2 10*3/uL — ABNORMAL HIGH (ref 4.0–10.5)

## 2015-05-20 MED ORDER — WHITE PETROLATUM GEL
Status: AC
  Administered 2015-05-20: 0.2
  Filled 2015-05-20: qty 1

## 2015-05-20 MED ORDER — SODIUM CHLORIDE 0.9 % IV BOLUS (SEPSIS)
500.0000 mL | Freq: Once | INTRAVENOUS | Status: AC
  Administered 2015-05-20: 500 mL via INTRAVENOUS

## 2015-05-20 MED ORDER — PNEUMOCOCCAL VAC POLYVALENT 25 MCG/0.5ML IJ INJ
0.5000 mL | INJECTION | INTRAMUSCULAR | Status: DC
  Filled 2015-05-20: qty 0.5

## 2015-05-20 NOTE — Progress Notes (Signed)
Subjective: Patient seen this morning with no acute events overnight.  No new complaints.  BP was soft this morning with MAP 59 so was started on NS 500cc bolus.  O2 sats dropped at time of bolus to 88-89% with increase to 96% on 2L [place].  Patient denies any symptoms of shortness of breath, wheezing, chest pain.  No diarrhea since admission, no abdominal pain, no dysuria.  Seen by Andria Frames NP yesterday with possible biopsy today of vulvar mass today by Dr. Denman George.   Objective: Vital signs in last 24 hours: Filed Vitals:   05/20/15 0015 05/20/15 0028 05/20/15 0405 05/20/15 0633  BP:  105/66 86/53 90/51   Pulse:   70 87  Temp: 97.7 F (36.5 C)  98.2 F (36.8 C)   TempSrc: Oral  Oral   Resp:  20 19 21   Height:      Weight:      SpO2:  95% 92% 93%   Weight change:   Intake/Output Summary (Last 24 hours) at 05/20/15 0743 Last data filed at 05/20/15 0612  Gross per 24 hour  Intake 3722.5 ml  Output    550 ml  Net 3172.5 ml   General: resting in bed, no distress, frail appearing HEENT: EOMI, no scleral icterus Cardiac: RRR, no rubs, murmurs or gallops Pulm: clear to auscultation bilaterally, moving normal volumes of air Abd: soft, nontender, nondistended, BS present Ext: warm and well perfused, no pedal edema Neuro: alert and oriented X3, cranial nerves II-XII grossly intact  Lab Results: Basic Metabolic Panel:  Recent Labs Lab 05/18/15 1940 05/19/15 0510 05/19/15 1157  NA 139 137  --   K 4.0 3.4*  --   CL 101 105  --   CO2 26 23  --   GLUCOSE 111* 70  --   BUN 22* 14  --   CREATININE 0.97 0.93  --   CALCIUM 10.8* 9.5  --   MG  --   --  1.6*   Liver Function Tests:  Recent Labs Lab 05/18/15 1940  AST 21  ALT 8*  ALKPHOS 63  BILITOT 1.0  PROT 5.3*  ALBUMIN 2.4*   CBC:  Recent Labs Lab 05/18/15 1940 05/19/15 0510  WBC 23.2* 18.7*  NEUTROABS 21.1*  --   HGB 10.6* 9.6*  HCT 32.9* 30.5*  MCV 94.3 94.1  PLT 286 296   Coagulation:  Recent Labs Lab  05/19/15 0510  LABPROT 15.6*  INR 1.22   Urinalysis:  Recent Labs Lab 05/19/15 0410  COLORURINE YELLOW  LABSPEC 1.025  PHURINE 5.5  GLUCOSEU NEGATIVE  HGBUR LARGE*  BILIRUBINUR SMALL*  KETONESUR 15*  PROTEINUR 30*  UROBILINOGEN 1.0  NITRITE NEGATIVE  LEUKOCYTESUR SMALL*    Micro Results: Recent Results (from the past 240 hour(s))  Blood Culture (routine x 2)     Status: None (Preliminary result)   Collection Time: 05/18/15  7:30 PM  Result Value Ref Range Status   Specimen Description BLOOD LEFT FOREARM  Final   Special Requests BOTTLES DRAWN AEROBIC AND ANAEROBIC 3 ML  Final   Culture   Final    NO GROWTH < 24 HOURS Performed at [facility]    Report Status PENDING  Incomplete  Blood Culture (routine x 2)     Status: None (Preliminary result)   Collection Time: 05/18/15  8:05 PM  Result Value Ref Range Status   Specimen Description BLOOD RIGHT ANTECUBITAL  Final   Special Requests BOTTLES DRAWN AEROBIC AND ANAEROBIC 5  ML  Final   Culture   Final    NO GROWTH < 24 HOURS Performed at [facility]    Report Status PENDING  Incomplete  MRSA PCR Screening     Status: None   Collection Time: 05/19/15  6:22 AM  Result Value Ref Range Status   MRSA by PCR NEGATIVE NEGATIVE Final    Comment:        The GeneXpert MRSA Assay (FDA approved for NASAL specimens only), is one component of a comprehensive MRSA colonization surveillance program. It is not intended to diagnose MRSA infection nor to guide or monitor treatment for MRSA infections.    Studies/Results: Ct Abdomen Pelvis W Contrast  05/18/2015   CLINICAL DATA:  Hematuria. Patient states ongoing UTI 3 months. Feeling weaker and dizzy today.  EXAM: CT ABDOMEN AND PELVIS WITH CONTRAST  TECHNIQUE: Multidetector CT imaging of the abdomen and pelvis was performed using the standard protocol following bolus administration of intravenous contrast.  CONTRAST:  82mL OMNIPAQUE IOHEXOL 300 MG/ML SOLN,  168mL OMNIPAQUE IOHEXOL 300 MG/ML SOLN  COMPARISON:  Chest CT 04/20/2015  FINDINGS: Atelectasis/scarring over the posterior right lower lobe. Moderate size hiatal hernia unchanged.  Abdominal images demonstrate the liver, spleen, pancreas, gallbladder and adrenal glands to be within normal. Kidneys are normal in size without hydronephrosis or nephrolithiasis. There is a sub cm right renal cortical hypodensity too small to characterize but likely a cyst. Ureters are within normal. Appendix is normal.  There is minimal calcified plaque over the abdominal aorta. Small lipoma over the second portion of the duodenum.  There is mild diverticulosis of the sigmoid colon. There is wall thickening of the colon from the splenic flexure to the junction of the sigmoid colon in the left lower quadrant compatible with a mild acute colitis. No evidence of perforation. No adjacent free fluid. Mild mucosal enhancement of several small bowel loops in the pelvis which are nondilated. There is mild engorgement of the adjacent vasa recta as findings may be due to regional enteritis. The small bowel findings as well as the colonic findings together could be seen in inflammatory bowel disease such as Crohn's disease.  Pelvic images demonstrate the bladder, uterus, ovaries and rectum to be within normal. There is a heterogeneous abnormal left inguinal lymph node measuring 3.4 cm in diameter. There is a smaller adjacent low-density lymph node in the left inguinal region. There is a low-density rim enhancing with 2.1 cm left external iliac lymph node. This may be due to metastatic disease in this patient with bilateral pulmonary nodules and pleural effusions. Mild degenerate change of the spine and hips.  IMPRESSION: Colonic wall thickening from the splenic flexure to the junction of the descending colon to sigmoid colon likely representing an acute colitis of infectious or inflammatory nature. Mild mucosal enhancement of several small bowel  loops in the pelvis with engorgement of the Vasa recta as findings may be due to a regional enteritis although together with the colonic findings could be seen in inflammatory bowel disease due to Crohn's.  Abnormal left external iliac and inguinal adenopathy suggesting metastatic disease. Recommend clinical correlation and PET scan versus tissue diagnosis via biopsy of this left inguinal node.  Moderate size hiatal hernia unchanged.   Electronically Signed   By: Marin Olp M.D.   On: 05/18/2015 22:25   [place] Misc Soft Tissue  05/19/2015   CLINICAL DATA:  Vulvar mass with LEFT inguinal adenopathy by CT  EXAM: SOFT TISSUE ULTRASOUND - MISCELLANEOUS  TECHNIQUE: Ultrasound examination of the pelvic soft tissues was performed in the area of clinical concern at the LEFT inguinal region and vulva.  COMPARISON:  CT abdomen and pelvis 05/18/2015  FINDINGS: Large heterogeneous predominately hypoechoic involve are soft tissue mass identified measuring 9.2 x 8.4 x 5.1 cm.  Mass extends towards the vagina but full extent is poorly visualized due to sound attenuation and size of lesion.  Few small microcalcifications are seen within the mass.  Mass demonstrates significant internal vascularity on color Doppler imaging.  Small adjacent hypervascular mass identified 2.7 x 1.7 x 2.3 cm in size which could represent a additional tumor nodule or a metastatic lymph node.  Preceding CT exam showed heterogeneously enhancing abnormal LEFT inguinal and external iliac adenopathy.  Prior CT however did not demonstrate full extent of this tumor, did not extend far enough inferiorly.  IMPRESSION: Large vulvar mass highly suspicious for neoplasm, 9.2 x 8.4 x 5.1 cm in size, hypervascular and containing a few microcalcifications.  Additional subcutaneous nodule is seen which may represent a additional tumor or metastatic lymph node 2.7 x 1.7 x 2.3 cm in size.  Patient had LEFT inguinal and external iliac adenopathy by preceding CT.  Since  the origin and deep extent of this lesion are not well visualized, recommend MR imaging with and without contrast to further characterize.   Electronically Signed   By: Lavonia Dana M.D.   On: 05/19/2015 17:20   Dg Chest Port 1 View  05/19/2015   CLINICAL DATA:  Sepsis.  EXAM: PORTABLE CHEST - 1 VIEW  COMPARISON:  Chest CT 04/20/2015  FINDINGS: Left upper lobe pulmonary nodule is less well-defined than on prior exam. Additional pulmonary nodules on prior CT are not definitively seen. Heart size and mediastinal contours are unchanged. No confluent airspace disease to suggest pneumonia. No pleural effusion or pneumothorax.  IMPRESSION: 1.  No acute pulmonary process. 2. Left upper lobe pulmonary nodule, as seen on recent chest CT. Additional pulmonary nodules on CT are not seen radiographically.   Electronically Signed   By: Jeb Levering M.D.   On: 05/19/2015 05:38   Medications: Scheduled Meds: . enoxaparin (LOVENOX) injection  30 mg Subcutaneous Q24H  . feeding supplement  1 Container Oral BID BM  . piperacillin-tazobactam (ZOSYN)  IV  3.375 g Intravenous Q8H  . sodium chloride  1,000 mL Intravenous Once  . sodium chloride  3 mL Intravenous Q12H  . vancomycin  500 mg Oral 4 times per day   Continuous Infusions: . lactated ringers 125 mL/hr at 05/20/15 0435   PRN Meds:.acetaminophen **OR** acetaminophen, ondansetron **OR** ondansetron (ZOFRAN) IV Assessment/Plan: Active Problems:   Shock   Sepsis   Vulvar mass  Carly Schwartz is a 69 year old lady with PMH of HTN, presenting with sepsis and vaginal mass.   Severe sepsis: on admission patient had 2/4 SIRS criteria and was hypotensive, with adequate response to IV fluids.  She was recently admitted for UTI and treated with ceftriaxone and ciprofloxacin (no bug isolated).  Initially concern for recurrent UTI in the setting of urethral obstruction from vaginal mass.  However, UA at admission with small amount leukocytes and negative  nitrites.  Culture pending as of 9/15. CT scan results with colonic wall thickening from the splenic flexure to the junction of the descending colon to sigmoid colon likely representing an acute colitis of infectious or inflammatory nature.  -continue Zosyn per pharmacy -will discontinue vancomycin 500mg  PO q6h x 14 as patient without  complaints of diarrhea currently and diarrhea had resolved prior to treating with PO vanc -Goal MAP >65.  Continue to monitor, bolus as needed. -Lactated ringers @125  ml/hr -lactic acid 2.87 >> 1.46 with IV fluids  -WBC trend: 23.2  >> 18.7 >> 13.2 today -blood cultures x 2 NGTD -urinalysis without evidence of UTI.  Urine culture pending  Vulvar Mass: Large, fungating mass protruding from vagina. Decreased appetite and 18 lb weight loss.  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 Appreciate their assistance. -follow upGyn Onc recommendations  Diarrhea: Evidence of colitis on CT. Unclear etiology -no current diarrhea since admission -FOBT positive -follow up C. Diff PCR >> only FOBT card sent to micro lab and they need stool cup so PCR not able to be obtained.  However, patient states diarrhea resolved prior to starting PO vancomycin and currently still without complaints of diarrhea. -discontinue vancomycin PO  HTN: Stable. No home meds.  Dispo: Disposition is deferred at this time, awaiting improvement of current medical problems.   The patient does not have a current PCP (No Pcp Per Patient) and does need an [facility]  hospital follow-up appointment after discharge.  The patient does not have transportation limitations that hinder transportation to clinic appointments.    LOS: 1 day   Jule Ser, DO 05/20/2015, 7:43 AM

## 2015-05-20 NOTE — Progress Notes (Signed)
0633 Normal saline bolus 500 cc hung set to run over two hours.Since hanging bolus IVF patient oxygen saturations dropping down 88 -89% on room air.Lungs clear but diminished.Oxygen at 2 liters nasal cannula placed and saturations gradually increased to 96%.Will continue to monitor.

## 2015-05-20 NOTE — Progress Notes (Signed)
Patient seen and examined. Case discussed with residents in detail. I agree with findings and plan as documented in Dr. Alcario Drought note.   Patient still with soft BPs. Will monitor in SDU. Will continue with IV zosyn. Diarrhea had resolved prior to initiation of PO vanco and patient with no further BM. Will hold PO vanco and c.w IV zosyn for now. Leukocytosis resolving. 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 Will need PET scan as an outpatient. Gyn Onc consult appreciated. Patient for possible biopsy of vaginal mass today.

## 2015-05-21 DIAGNOSIS — C519 Malignant neoplasm of vulva, unspecified: Secondary | ICD-10-CM

## 2015-05-21 DIAGNOSIS — R197 Diarrhea, unspecified: Secondary | ICD-10-CM

## 2015-05-21 LAB — C DIFFICILE QUICK SCREEN W PCR REFLEX
C DIFFICILE (CDIFF) INTERP: NEGATIVE
C DIFFICILE (CDIFF) TOXIN: NEGATIVE
C Diff antigen: NEGATIVE

## 2015-05-21 LAB — CBC
HCT: 24.4 % — ABNORMAL LOW (ref 36.0–46.0)
HEMOGLOBIN: 8.1 g/dL — AB (ref 12.0–15.0)
MCH: 30.9 pg (ref 26.0–34.0)
MCHC: 33.2 g/dL (ref 30.0–36.0)
MCV: 93.1 fL (ref 78.0–100.0)
Platelets: 189 10*3/uL (ref 150–400)
RBC: 2.62 MIL/uL — AB (ref 3.87–5.11)
RDW: 15.2 % (ref 11.5–15.5)
WBC: 14 10*3/uL — ABNORMAL HIGH (ref 4.0–10.5)

## 2015-05-21 LAB — URINE CULTURE

## 2015-05-21 LAB — CORTISOL: CORTISOL PLASMA: 16.9 ug/dL

## 2015-05-21 MED ORDER — INFLUENZA VAC SPLIT QUAD 0.5 ML IM SUSY
0.5000 mL | PREFILLED_SYRINGE | INTRAMUSCULAR | Status: DC
  Filled 2015-05-21: qty 0.5

## 2015-05-21 MED ORDER — METRONIDAZOLE 500 MG PO TABS
500.0000 mg | ORAL_TABLET | Freq: Four times a day (QID) | ORAL | Status: DC
  Administered 2015-05-21 – 2015-05-22 (×4): 500 mg via ORAL
  Filled 2015-05-21 (×8): qty 1

## 2015-05-21 MED ORDER — CIPROFLOXACIN HCL 500 MG PO TABS
500.0000 mg | ORAL_TABLET | Freq: Two times a day (BID) | ORAL | Status: DC
  Administered 2015-05-21 – 2015-05-22 (×3): 500 mg via ORAL
  Filled 2015-05-21 (×5): qty 1

## 2015-05-21 NOTE — Evaluation (Addendum)
Physical Therapy Evaluation Patient Details Name: Carly Schwartz MRN: [id number] DOB: 1946-04-14 Today's Date: 05/21/2015   History of Present Illness  Patient is a 69 yo female admitted 05/18/15 with fatigue, weakness, UTI, hypotension, dehydration, N/V/D.  Patient with sepsis/shock.  Patient also found to have vaginal mass - Oncology workup underway.   PMH:  HTN, UTI    Clinical Impression  Patient presents with problems listed below.  Will benefit from acute PT to maximize functional independence prior to return home with family.  Patient with general weakness impacting function.  Recommended HHPT, however patient declined these services.  Recommend RW for home use for balance/safety.   Patient will need to function at supervision level, and be able to manage flight of stairs to enter apartment.    Follow Up Recommendations Supervision for mobility/OOB;Home health PT (Patient declined HHPT)    Equipment Recommendations  Rolling walker with 5" wheels    Recommendations for Other Services       Precautions / Restrictions Precautions Precautions: Fall Restrictions Weight Bearing Restrictions: No      Mobility  Bed Mobility Overal bed mobility: Needs Assistance Bed Mobility: Supine to Sit     Supine to sit: Min assist     General bed mobility comments: Assist to raise trunk to upright position.  Difficult sitting due to discomfort.  Transfers Overall transfer level: Needs assistance Equipment used: 1 person hand held assist Transfers: Sit to/from Stand Sit to Stand: Min assist         General transfer comment: Verbal cues for technique.  Assist to rise to standing and to steady.  Shaky in stance.  Ambulation/Gait Ambulation/Gait assistance: Min assist Ambulation Distance (Feet): 4 Feet Assistive device: 1 person hand held assist Gait Pattern/deviations: Step-through pattern;Decreased step length - right;Decreased step length - left;Shuffle;Trunk flexed      General Gait Details: Patient able to take several steps toward chair.  Gait unsteady requiring UE support.  Shaky during gait due to weakness.  Stairs            Wheelchair Mobility    Modified Rankin (Stroke Patients Only)       Balance Overall balance assessment: Needs assistance         Standing balance support: Single extremity supported Standing balance-Leahy Scale: Poor                               Pertinent Vitals/Pain Pain Assessment: 0-10 Pain Score: 2  Pain Location: Location of biopsy (vaginal area) Pain Descriptors / Indicators: Sore Pain Intervention(s): Monitored during session;Repositioned    Home Living Family/patient expects to be discharged to:: Private residence Living Arrangements: Spouse/significant other;Children (Boyfriend and daughter) Available Help at Discharge: Family;Available 24 hours/day (except 2-3 hours in am while daughter in classes.) Type of Home: Apartment (Basement apartment) Home Access: Stairs to enter Entrance Stairs-Rails: Psychiatric nurse of Steps: 15-20 steps down to apartment Home Layout: One level Home Equipment: None      Prior Function Level of Independence: Independent               Hand Dominance        Extremity/Trunk Assessment   Upper Extremity Assessment: Generalized weakness           Lower Extremity Assessment: Generalized weakness         Communication   Communication: No difficulties  Cognition Arousal/Alertness: Awake/alert Behavior During Therapy: Flat affect;Anxious Overall Cognitive Status:  Within Functional Limits for tasks assessed                      General Comments      Exercises        Assessment/Plan    PT Assessment Patient needs continued PT services  PT Diagnosis Difficulty walking;Generalized weakness;Acute pain   PT Problem List Decreased strength;Decreased activity tolerance;Decreased balance;Decreased  mobility;Decreased knowledge of use of DME;Pain  PT Treatment Interventions DME instruction;Gait training;Stair training;Functional mobility training;Therapeutic activities;Patient/family education;Balance training   PT Goals (Current goals can be found in the Care Plan section) Acute Rehab PT Goals Patient Stated Goal: To get stronger.  To go home PT Goal Formulation: With patient Time For Goal Achievement: 05/28/15 Potential to Achieve Goals: Good    Frequency Min 3X/week   Barriers to discharge Inaccessible home environment;Decreased caregiver support Patient has 15-20 steps to enter her apartment.  Patient will be alone at home for 2-3 hours each day.    Co-evaluation               End of Session Equipment Utilized During Treatment: Gait belt Activity Tolerance: Patient limited by fatigue Patient left: in chair;with call bell/phone within reach Nurse Communication: Mobility status (In chair)         Time: 3354-5625 PT Time Calculation (min) (ACUTE ONLY): 20 min   Charges:   PT Evaluation $Initial PT Evaluation Tier I: 1 Procedure     PT G CodesDespina Pole June 13, 2015, 4:39 PM Carita Pian. Sanjuana Kava, [place] Pager [phone]

## 2015-05-21 NOTE — Care Management Important Message (Signed)
Important Message  Patient Details  Name: Carly Schwartz MRN: [id number] Date of Birth: Sep 01, 1946   Medicare Important Message Given:  Yes-second notification given    Nathen May 05/21/2015, 11:30 AM

## 2015-05-21 NOTE — Progress Notes (Signed)
Subjective: Patient seen this morning without any acute complaints overnight.  Had vulvar biopsy yesterday by Gyn Onc without complications.  Had one episode of diarrhea yesterday that enabled [place] to test for C. Diff, which was negative.  Only complaint is frequent urination due to the continuous IV fluids she has been receiving since admission.  BP remaining somewhat soft.  Denies any symptoms of dizziness, lightheadedness.  No shortness of breath, chest pain, abdominal pain.    Objective: Vital signs in last 24 hours: Filed Vitals:   05/21/15 0455 05/21/15 0600 05/21/15 0826 05/21/15 1218  BP: 84/49 102/65 75/45 80/42   Pulse: 86 84 84 89  Temp: 98.8 F (37.1 C)  98.4 F (36.9 C) 98.2 F (36.8 C)  TempSrc: Oral  Oral Oral  Resp: 23 17 20 24   Height:      Weight:      SpO2: 94% 90% 92% 93%   Weight change:   Intake/Output Summary (Last 24 hours) at 05/21/15 1246 Last data filed at 05/21/15 0700  Gross per 24 hour  Intake 1679.25 ml  Output    900 ml  Net 779.25 ml   General: resting in bed, no distress, frail appearing HEENT: EOMI, no scleral icterus Cardiac: RRR, no rubs, murmurs or gallops Pulm: clear to auscultation bilaterally, moving normal volumes of air Abd: soft, nontender, nondistended, BS present Ext: warm and well perfused, no pedal edema Neuro: alert and oriented X3, cranial nerves II-XII grossly intact  Lab Results: Basic Metabolic Panel:  Recent Labs Lab 05/19/15 0510 05/19/15 1157 05/20/15 0745  NA 137  --  134*  K 3.4*  --  3.6  CL 105  --  102  CO2 23  --  24  GLUCOSE 70  --  80  BUN 14  --  6  CREATININE 0.93  --  0.81  CALCIUM 9.5  --  9.2  MG  --  1.6*  --    Liver Function Tests:  Recent Labs Lab 05/18/15 1940  AST 21  ALT 8*  ALKPHOS 63  BILITOT 1.0  PROT 5.3*  ALBUMIN 2.4*   CBC:  Recent Labs Lab 05/18/15 1940  05/20/15 0745 05/21/15 0920  WBC 23.2*  < > 13.2* 14.0*  NEUTROABS 21.1*  --   --   --   HGB 10.6*  < >  8.8* 8.1*  HCT 32.9*  < > 27.7* 24.4*  MCV 94.3  < > 94.2 93.1  PLT 286  < > 235 189  < > = values in this interval not displayed. Coagulation:  Recent Labs Lab 05/19/15 0510  LABPROT 15.6*  INR 1.22   Urinalysis:  Recent Labs Lab 05/19/15 0410  COLORURINE YELLOW  LABSPEC 1.025  PHURINE 5.5  GLUCOSEU NEGATIVE  HGBUR LARGE*  BILIRUBINUR SMALL*  KETONESUR 15*  PROTEINUR 30*  UROBILINOGEN 1.0  NITRITE NEGATIVE  LEUKOCYTESUR SMALL*    Micro Results: Recent Results (from the past 240 hour(s))  Blood Culture (routine x 2)     Status: None (Preliminary result)   Collection Time: 05/18/15  7:30 PM  Result Value Ref Range Status   Specimen Description BLOOD LEFT FOREARM  Final   Special Requests BOTTLES DRAWN AEROBIC AND ANAEROBIC 3 ML  Final   Culture   Final    NO GROWTH 3 DAYS Performed at [facility]    Report Status PENDING  Incomplete  Blood Culture (routine x 2)     Status: None (Preliminary result)   Collection  Time: 05/18/15  8:05 PM  Result Value Ref Range Status   Specimen Description BLOOD RIGHT ANTECUBITAL  Final   Special Requests BOTTLES DRAWN AEROBIC AND ANAEROBIC 5 ML  Final   Culture   Final    NO GROWTH 3 DAYS Performed at [facility]    Report Status PENDING  Incomplete  Urine culture     Status: None   Collection Time: 05/19/15  4:10 AM  Result Value Ref Range Status   Specimen Description URINE, RANDOM  Final   Special Requests NONE  Final   Culture MULTIPLE SPECIES PRESENT, SUGGEST RECOLLECTION  Final   Report Status 05/21/2015 FINAL  Final  MRSA PCR Screening     Status: None   Collection Time: 05/19/15  6:22 AM  Result Value Ref Range Status   MRSA by PCR NEGATIVE NEGATIVE Final    Comment:        The GeneXpert MRSA Assay (FDA approved for NASAL specimens only), is one component of a comprehensive MRSA colonization surveillance program. It is not intended to diagnose MRSA infection nor to guide or monitor  treatment for MRSA infections.   C difficile quick scan w PCR reflex     Status: None   Collection Time: 05/21/15  1:55 AM  Result Value Ref Range Status   C Diff antigen NEGATIVE NEGATIVE Final   C Diff toxin NEGATIVE NEGATIVE Final   C Diff interpretation Negative for toxigenic C. difficile  Final   Studies/Results: [place] Misc Soft Tissue  05/19/2015   CLINICAL DATA:  Vulvar mass with LEFT inguinal adenopathy by CT  EXAM: SOFT TISSUE ULTRASOUND - MISCELLANEOUS  TECHNIQUE: Ultrasound examination of the pelvic soft tissues was performed in the area of clinical concern at the LEFT inguinal region and vulva.  COMPARISON:  CT abdomen and pelvis 05/18/2015  FINDINGS: Large heterogeneous predominately hypoechoic involve are soft tissue mass identified measuring 9.2 x 8.4 x 5.1 cm.  Mass extends towards the vagina but full extent is poorly visualized due to sound attenuation and size of lesion.  Few small microcalcifications are seen within the mass.  Mass demonstrates significant internal vascularity on color Doppler imaging.  Small adjacent hypervascular mass identified 2.7 x 1.7 x 2.3 cm in size which could represent a additional tumor nodule or a metastatic lymph node.  Preceding CT exam showed heterogeneously enhancing abnormal LEFT inguinal and external iliac adenopathy.  Prior CT however did not demonstrate full extent of this tumor, did not extend far enough inferiorly.  IMPRESSION: Large vulvar mass highly suspicious for neoplasm, 9.2 x 8.4 x 5.1 cm in size, hypervascular and containing a few microcalcifications.  Additional subcutaneous nodule is seen which may represent a additional tumor or metastatic lymph node 2.7 x 1.7 x 2.3 cm in size.  Patient had LEFT inguinal and external iliac adenopathy by preceding CT.  Since the origin and deep extent of this lesion are not well visualized, recommend MR imaging with and without contrast to further characterize.   Electronically Signed   By: Lavonia Dana M.D.    On: 05/19/2015 17:20   Medications: Scheduled Meds: . ciprofloxacin  500 mg Oral BID  . enoxaparin (LOVENOX) injection  30 mg Subcutaneous Q24H  . feeding supplement  1 Container Oral BID BM  . [START ON 05/22/2015] Influenza vac split quadrivalent PF  0.5 mL Intramuscular Tomorrow-1000  . metroNIDAZOLE  500 mg Oral 4 times per day  . pneumococcal 23 valent vaccine  0.5 mL Intramuscular Tomorrow-1000  .  sodium chloride  1,000 mL Intravenous Once  . sodium chloride  3 mL Intravenous Q12H   Continuous Infusions:   PRN Meds:.acetaminophen **OR** acetaminophen, ondansetron **OR** ondansetron (ZOFRAN) IV Assessment/Plan: Active Problems:   Shock   Sepsis   Vulvar mass   Vaginal mass  [name] is a 69 year old lady with PMH of HTN, presenting with sepsis and vaginal mass.   Severe sepsis: on admission patient had 2/4 SIRS criteria and was hypotensive, with adequate response to IV fluids.  She was recently admitted for UTI and treated with ceftriaxone and ciprofloxacin (no bug isolated).  Initially concern for recurrent UTI in the setting of urethral obstruction from vaginal mass.  However, UA at admission with small amount leukocytes and negative nitrites. CT scan results with colonic wall thickening from the splenic flexure to the junction of the descending colon to sigmoid colon likely representing an acute colitis of infectious or inflammatory nature.  -discontinue zosyn and transition to PO meds >> Ciprofloxacin 500mg  PO bid and Metronidazole 500mg  PO q6h -C. Diff negative >> no need for vancomycin PO -Goal MAP >65.  Continue to monitor, bolus as needed.  Given continued relative hypotension, cortisol checked >> 16.9 WNL -discontinue lactated ringers @125  ml/hr -lactic acid 2.87 >> 1.46 with IV fluids  -WBC trend: 23.2  >> 18.7 >> 13.2 >> 14.0 today -blood cultures x 2 NGTD -urinalysis without evidence of UTI.  Urine culture with multiple species present.  Continue with  antibiotics as above for colitis  Vulvar Mass: Large, fungating mass protruding from vagina. Decreased appetite and 18 lb weight loss.  As patient has local adenopathy and pulmonary nodules, she is likely Stage IV -consult Gyn Onc. Appreciate their assistance -biopsy performed yesterday.  They will facilitate outpatient follow up for the patient next week at which time biopsy and PET results should be available  Diarrhea: Evidence of colitis on CT. Unclear etiology -no current diarrhea since admission -FOBT positive -follow up C. Diff PCR >> C. Diff negative -discontinue vancomycin PO  HTN: Stable. No home meds.  Dispo: Disposition is deferred at this time, awaiting improvement of current medical problems.   The patient does not have a current PCP (No Pcp Per Patient) and does need an [facility] hospital follow-up appointment after discharge.  The patient does not have transportation limitations that hinder transportation to clinic appointments.    LOS: 2 days   Jule Ser, DO 05/21/2015, 12:46 PM

## 2015-05-21 NOTE — Progress Notes (Signed)
PT Cancellation Note  Patient Details Name: CYNARA TATHAM MRN: [id number] DOB: 1946-03-05   Cancelled Treatment:    Reason Eval/Treat Not Completed: Medical issues which prohibited therapy. Patient remains on strict bedrest per orders.  MD:  Please write activity orders when appropriate for patient.  PT will initiate evaluation at that time.  Thank you.   Despina Pole 05/21/2015, 12:31 PM Carita Pian. Sanjuana Kava, [place] Pager [phone]

## 2015-05-21 NOTE — Progress Notes (Signed)
   05/21/15 1500  Clinical Encounter Type  Visited With Other (Comment)  Visit Type Initial;Spiritual support  Referral From Nurse  Recommendations (To come back after quiet hours)  Chaplain approach floor for Pt; Chaplain found it was quiet hours; Chaplain will return after 4pm. Chaplain returned after quiet hours; Chaplain spoke to patient about various topics; Pt held the hand of the chaplain; Chaplain did not want to leave because of Pt warm and kind presence

## 2015-05-21 NOTE — Progress Notes (Signed)
Patient seen and examined.Case d/w residents in detail. I agree with findings and plan as documented in Dr. Alcario Drought note.  BP remains soft- uncertain if this is her baseline BP or if she is hypotensive secondary to her underlying colitis. C/w IV boluses prn but d/c standing IVF.  Will d/c zosyn and start PO cipro and flagyl to complete 7 day course. AM cortisol is wnl. Unlikely adrenal insufficiency causing hypotension.  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 Gyn/Onc f/u appreciated. She is s/p biopsy of mass yesterday. Will f/u with Dr. Denman George as an outpatient after PET scan. Unlikely surgical candidate but may be candidate for chemo and local RT.

## 2015-05-22 DIAGNOSIS — N9089 Other specified noninflammatory disorders of vulva and perineum: Secondary | ICD-10-CM

## 2015-05-22 DIAGNOSIS — A419 Sepsis, unspecified organism: Secondary | ICD-10-CM | POA: Insufficient documentation

## 2015-05-22 DIAGNOSIS — R1900 Intra-abdominal and pelvic swelling, mass and lump, unspecified site: Secondary | ICD-10-CM

## 2015-05-22 DIAGNOSIS — R6521 Severe sepsis with septic shock: Secondary | ICD-10-CM

## 2015-05-22 LAB — FERRITIN: Ferritin: 159 ng/mL (ref 11–307)

## 2015-05-22 LAB — CBC WITH DIFFERENTIAL/PLATELET
Basophils Absolute: 0 10*3/uL (ref 0.0–0.1)
Basophils Relative: 0 %
EOS ABS: 0.4 10*3/uL (ref 0.0–0.7)
EOS PCT: 3 %
HCT: 27.3 % — ABNORMAL LOW (ref 36.0–46.0)
Hemoglobin: 8.7 g/dL — ABNORMAL LOW (ref 12.0–15.0)
LYMPHS ABS: 1 10*3/uL (ref 0.7–4.0)
LYMPHS PCT: 8 %
MCH: 29.7 pg (ref 26.0–34.0)
MCHC: 31.9 g/dL (ref 30.0–36.0)
MCV: 93.2 fL (ref 78.0–100.0)
MONO ABS: 0.8 10*3/uL (ref 0.1–1.0)
MONOS PCT: 6 %
Neutro Abs: 10.1 10*3/uL — ABNORMAL HIGH (ref 1.7–7.7)
Neutrophils Relative %: 83 %
PLATELETS: 191 10*3/uL (ref 150–400)
RBC: 2.93 MIL/uL — AB (ref 3.87–5.11)
RDW: 15.3 % (ref 11.5–15.5)
WBC: 12.3 10*3/uL — AB (ref 4.0–10.5)

## 2015-05-22 LAB — VITAMIN B12: VITAMIN B 12: 251 pg/mL (ref 180–914)

## 2015-05-22 LAB — IRON AND TIBC: IRON: 23 ug/dL — AB (ref 28–170)

## 2015-05-22 LAB — RETICULOCYTES
RBC.: 2.78 MIL/uL — AB (ref 3.87–5.11)
RETIC COUNT ABSOLUTE: 52.8 10*3/uL (ref 19.0–186.0)
Retic Ct Pct: 1.9 % (ref 0.4–3.1)

## 2015-05-22 LAB — FOLATE: FOLATE: 2.9 ng/mL — AB (ref >5.9)

## 2015-05-22 MED ORDER — BOOST / RESOURCE BREEZE PO LIQD: 1.0000 | Freq: Two times a day (BID) | ORAL | Status: DC

## 2015-05-22 MED ORDER — CIPROFLOXACIN HCL 500 MG PO TABS: 500.0000 mg | ORAL_TABLET | Freq: Two times a day (BID) | ORAL | Status: AC

## 2015-05-22 MED ORDER — METRONIDAZOLE 500 MG PO TABS: 500.0000 mg | ORAL_TABLET | Freq: Four times a day (QID) | ORAL | Status: DC

## 2015-05-22 MED ORDER — ONDANSETRON HCL 4 MG PO TABS: 4.0000 mg | ORAL_TABLET | Freq: Four times a day (QID) | ORAL | Status: DC | PRN

## 2015-05-22 NOTE — Progress Notes (Signed)
Subjective: Patient feeling fine today. Has some nausea. No longer having diarrhea. No ab pain, sob, cp, fever, chills. Her blood pressure has been in 80-90's overnight without fluid, this is the same as when she was on fluids. Wants to go home.  Objective: Vital signs in last 24 hours: Filed Vitals:   05/21/15 2000 05/22/15 0000 05/22/15 0400 05/22/15 0700  BP: 79/46 91/53 95/53  82/49  Pulse: 71 72 86 74  Temp: 97.8 F (36.6 C) 97.3 F (36.3 C) 98.3 F (36.8 C) 98.5 F (36.9 C)  TempSrc: Oral Oral Oral Oral  Resp: 16 17 25 17   Height:      Weight:      SpO2: 93% 92% 92% 91%   Weight change:   Intake/Output Summary (Last 24 hours) at 05/22/15 1020 Last data filed at 05/22/15 0050  Gross per 24 hour  Intake    243 ml  Output    475 ml  Net   -232 ml   General: resting in bed, no distress, frail appearing HEENT: EOMI, no scleral icterus Cardiac: RRR, no rubs, murmurs or gallops Pulm: clear to auscultation bilaterally, moving normal volumes of air Abd: soft, nontender, nondistended, BS present Ext: warm and well perfused, no pedal edema Neuro: alert and oriented X3, cranial nerves II-XII grossly intact  Lab Results: Basic Metabolic Panel:  Recent Labs Lab 05/19/15 0510 05/19/15 1157 05/20/15 0745  NA 137  --  134*  K 3.4*  --  3.6  CL 105  --  102  CO2 23  --  24  GLUCOSE 70  --  80  BUN 14  --  6  CREATININE 0.93  --  0.81  CALCIUM 9.5  --  9.2  MG  --  1.6*  --    Liver Function Tests:  Recent Labs Lab 05/18/15 1940  AST 21  ALT 8*  ALKPHOS 63  BILITOT 1.0  PROT 5.3*  ALBUMIN 2.4*   CBC:  Recent Labs Lab 05/18/15 1940  05/20/15 0745 05/21/15 0920  WBC 23.2*  < > 13.2* 14.0*  NEUTROABS 21.1*  --   --   --   HGB 10.6*  < > 8.8* 8.1*  HCT 32.9*  < > 27.7* 24.4*  MCV 94.3  < > 94.2 93.1  PLT 286  < > 235 189  < > = values in this interval not displayed. Coagulation:  Recent Labs Lab 05/19/15 0510  LABPROT 15.6*  INR 1.22    Urinalysis:  Recent Labs Lab 05/19/15 0410  COLORURINE YELLOW  LABSPEC 1.025  PHURINE 5.5  GLUCOSEU NEGATIVE  HGBUR LARGE*  BILIRUBINUR SMALL*  KETONESUR 15*  PROTEINUR 30*  UROBILINOGEN 1.0  NITRITE NEGATIVE  LEUKOCYTESUR SMALL*    Micro Results: Recent Results (from the past 240 hour(s))  Blood Culture (routine x 2)     Status: None (Preliminary result)   Collection Time: 05/18/15  7:30 PM  Result Value Ref Range Status   Specimen Description BLOOD LEFT FOREARM  Final   Special Requests BOTTLES DRAWN AEROBIC AND ANAEROBIC 3 ML  Final   Culture   Final    NO GROWTH 3 DAYS Performed at [facility]    Report Status PENDING  Incomplete  Blood Culture (routine x 2)     Status: None (Preliminary result)   Collection Time: 05/18/15  8:05 PM  Result Value Ref Range Status   Specimen Description BLOOD RIGHT ANTECUBITAL  Final   Special Requests BOTTLES DRAWN AEROBIC AND  ANAEROBIC 5 ML  Final   Culture   Final    NO GROWTH 3 DAYS Performed at [facility]    Report Status PENDING  Incomplete  Urine culture     Status: None   Collection Time: 05/19/15  4:10 AM  Result Value Ref Range Status   Specimen Description URINE, RANDOM  Final   Special Requests NONE  Final   Culture MULTIPLE SPECIES PRESENT, SUGGEST RECOLLECTION  Final   Report Status 05/21/2015 FINAL  Final  MRSA PCR Screening     Status: None   Collection Time: 05/19/15  6:22 AM  Result Value Ref Range Status   MRSA by PCR NEGATIVE NEGATIVE Final    Comment:        The GeneXpert MRSA Assay (FDA approved for NASAL specimens only), is one component of a comprehensive MRSA colonization surveillance program. It is not intended to diagnose MRSA infection nor to guide or monitor treatment for MRSA infections.   C difficile quick scan w PCR reflex     Status: None   Collection Time: 05/21/15  1:55 AM  Result Value Ref Range Status   C Diff antigen NEGATIVE NEGATIVE Final   C Diff toxin  NEGATIVE NEGATIVE Final   C Diff interpretation Negative for toxigenic C. difficile  Final   Studies/Results: No results found. Medications: Scheduled Meds: . ciprofloxacin  500 mg Oral BID  . enoxaparin (LOVENOX) injection  30 mg Subcutaneous Q24H  . feeding supplement  1 Container Oral BID BM  . Influenza vac split quadrivalent PF  0.5 mL Intramuscular Tomorrow-1000  . metroNIDAZOLE  500 mg Oral 4 times per day  . pneumococcal 23 valent vaccine  0.5 mL Intramuscular Tomorrow-1000  . sodium chloride  1,000 mL Intravenous Once  . sodium chloride  3 mL Intravenous Q12H   Continuous Infusions:   PRN Meds:.acetaminophen **OR** acetaminophen, ondansetron **OR** ondansetron (ZOFRAN) IV Assessment/Plan: Active Problems:   Shock   Sepsis   Vulvar mass   Vaginal mass  [name] is a 69 year old lady with PMH of HTN, presenting with sepsis and vaginal mass.   Severe sepsis 2/2 to Colitis - improving on admission patient had 2/4 SIRS criteria and was hypotensive, with adequate response to IV fluids.  She was recently admitted for UTI and treated with ceftriaxone and ciprofloxacin (no bug isolated).  Initially concern for recurrent UTI in the setting of urethral obstruction from vaginal mass.  However, UA at admission with small amount leukocytes and negative nitrites. CT scan results with colonic wall thickening from the splenic flexure to the junction of the descending colon to sigmoid colon likely representing an acute colitis of infectious or inflammatory nature.  -was on zosyn >> Ciprofloxacin 500mg  PO bid and Metronidazole 500mg  PO q6h >> cont cipro and flagyl PO to complete course on 05/27/15 for total 10 days.  -C. Diff negative >>vancomycin PO - stopped - BP is likely low at baseline, her BP stayed in 80-90's on fluid and also without fluid. She is not symptomatic from this. Will not treat this BP as this is likely her baseline.  -blood cultures x 2 NGTD - zofran for nausea.     Vulvar Mass: Large, fungating mass protruding from vagina. Decreased appetite and 18 lb weight loss.  As patient has local adenopathy and pulmonary nodules, she is likely Stage IV -consult Gyn Onc. Appreciate their assistance. S/p biopsy. Awaiting path results.  - Dr. Denman George will arrange outpatient follow up for  the patient next week at which time biopsy and PET results should be available  Anemia - baseline 9-10. Now 8's. Mild drop can be from biopsy + blood draws + fluid - consider outpatient Anemia panel.    Dispo: home today. Declined HHPT. Ordered rolling walker. Will setup appt at [facility] center for outpatient follow up and Dr. Serita Grit office will call patient for [facility] follow up.    LOS: 3 days   Dellia Nims, MD 05/22/2015, 10:20 AM

## 2015-05-22 NOTE — Progress Notes (Signed)
PT Cancellation Note  Patient Details Name: Carly Schwartz MRN: [id number] DOB: 26-Sep-1945   Cancelled Treatment:    Reason Eval/Treat Not Completed: Other (comment) [Pt refused.  States she has boyfriend and granddaughter who will assist x 24 hours at d/c.  Daughter present and confirms this.  Pt refused to get OOB and to go up and down stairs (she has 20 to get into apartment)].  Supposed to d/c today.  Nurse aware.  Thanks.    Irwin Brakeman F 05/22/2015, 2:39 PM  [facility] [phone] (pager)

## 2015-05-22 NOTE — Discharge Summary (Signed)
Name: Carly Schwartz MRN: [id number] DOB: 18-Nov-1945 69 y.o. PCP: No Pcp Per Patient  Date of Admission: 05/18/2015  7:09 PM Date of Discharge: 05/22/2015 Attending Physician: Aldine Contes, MD  Discharge Diagnosis: 1.  Active Problems:   Shock   Sepsis   Vulvar mass   Vaginal mass   Pelvic mass   Septic shock  Discharge Medications:   Medication List    TAKE these medications        ciprofloxacin 500 MG tablet  Commonly known as:  CIPRO  Take 1 tablet (500 mg total) by mouth 2 (two) times daily.     feeding supplement Liqd  Take 1 Container by mouth 2 (two) times daily between meals.     metroNIDAZOLE 500 MG tablet  Commonly known as:  FLAGYL  Take 1 tablet (500 mg total) by mouth every 6 (six) hours.     ondansetron 4 MG tablet  Commonly known as:  ZOFRAN  Take 1 tablet (4 mg total) by mouth every 6 (six) hours as needed for nausea.        Disposition and follow-up:   Carly Schwartz was discharged from [facility] in [place] condition.  At the hospital follow up visit please address:  1.  Please continue [place] discussion. We had some discussion in the hospital. Patient wants to remain full care and full code for now.   Sending home to complete Abx for colitis Cipro + flagyl until 05/27/2015 for 10 day course. Please assess if she completed this.  Follow up BP. She had low BP 80-90's here in the hospital without any improvement with fluids. This may be her baseline.   Please make sure she follows up with [facility] for vulvar mass and gets outpatient PET and other workup per GynOnc recs.  We recommended home health PT but patient refused.   2.  Labs / imaging needed at time of follow-up: CBC  3.  Pending labs/ test needing follow-up: anemia panel  Follow-up Appointments: Follow-up Information    Schedule an appointment as soon as possible for a visit with Donaciano Eva, MD.   Specialty:  Obstetrics and Gynecology   Contact  information:   [place] [address] [phone]       Schedule an appointment as soon as possible for a visit with Zada Finders, MD.   Specialty:  Internal Medicine   Contact information:   [place] [address] [phone]       Discharge Instructions:   Consultations:    Procedures Performed:  Ct Abdomen Pelvis W Contrast  05/18/2015   CLINICAL DATA:  Hematuria. Patient states ongoing UTI 3 months. Feeling weaker and dizzy today.  EXAM: CT ABDOMEN AND PELVIS WITH CONTRAST  TECHNIQUE: Multidetector CT imaging of the abdomen and pelvis was performed using the standard protocol following bolus administration of intravenous contrast.  CONTRAST:  37mL OMNIPAQUE IOHEXOL 300 MG/ML SOLN, 154mL OMNIPAQUE IOHEXOL 300 MG/ML SOLN  COMPARISON:  Chest CT 04/20/2015  FINDINGS: Atelectasis/scarring over the posterior right lower lobe. Moderate size hiatal hernia unchanged.  Abdominal images demonstrate the liver, spleen, pancreas, gallbladder and adrenal glands to be within normal. Kidneys are normal in size without hydronephrosis or nephrolithiasis. There is a sub cm right renal cortical hypodensity too small to characterize but likely a cyst. Ureters are within normal. Appendix is normal.  There is minimal calcified plaque over the abdominal aorta. Small lipoma over the second portion of  the duodenum.  There is mild diverticulosis of the sigmoid colon. There is wall thickening of the colon from the splenic flexure to the junction of the sigmoid colon in the left lower quadrant compatible with a mild acute colitis. No evidence of perforation. No adjacent free fluid. Mild mucosal enhancement of several small bowel loops in the pelvis which are nondilated. There is mild engorgement of the adjacent vasa recta as findings may be due to regional enteritis. The small bowel findings as well as the colonic findings together could be seen in inflammatory bowel disease such as  Crohn's disease.  Pelvic images demonstrate the bladder, uterus, ovaries and rectum to be within normal. There is a heterogeneous abnormal left inguinal lymph node measuring 3.4 cm in diameter. There is a smaller adjacent low-density lymph node in the left inguinal region. There is a low-density rim enhancing with 2.1 cm left external iliac lymph node. This may be due to metastatic disease in this patient with bilateral pulmonary nodules and pleural effusions. Mild degenerate change of the spine and hips.  IMPRESSION: Colonic wall thickening from the splenic flexure to the junction of the descending colon to sigmoid colon likely representing an acute colitis of infectious or inflammatory nature. Mild mucosal enhancement of several small bowel loops in the pelvis with engorgement of the Vasa recta as findings may be due to a regional enteritis although together with the colonic findings could be seen in inflammatory bowel disease due to Crohn's.  Abnormal left external iliac and inguinal adenopathy suggesting metastatic disease. Recommend clinical correlation and PET scan versus tissue diagnosis via biopsy of this left inguinal node.  Moderate size hiatal hernia unchanged.   Electronically Signed   By: Marin Olp M.D.   On: 05/18/2015 22:25   [place] Misc Soft Tissue  05/19/2015   CLINICAL DATA:  Vulvar mass with LEFT inguinal adenopathy by CT  EXAM: SOFT TISSUE ULTRASOUND - MISCELLANEOUS  TECHNIQUE: Ultrasound examination of the pelvic soft tissues was performed in the area of clinical concern at the LEFT inguinal region and vulva.  COMPARISON:  CT abdomen and pelvis 05/18/2015  FINDINGS: Large heterogeneous predominately hypoechoic involve are soft tissue mass identified measuring 9.2 x 8.4 x 5.1 cm.  Mass extends towards the vagina but full extent is poorly visualized due to sound attenuation and size of lesion.  Few small microcalcifications are seen within the mass.  Mass demonstrates significant internal  vascularity on color Doppler imaging.  Small adjacent hypervascular mass identified 2.7 x 1.7 x 2.3 cm in size which could represent a additional tumor nodule or a metastatic lymph node.  Preceding CT exam showed heterogeneously enhancing abnormal LEFT inguinal and external iliac adenopathy.  Prior CT however did not demonstrate full extent of this tumor, did not extend far enough inferiorly.  IMPRESSION: Large vulvar mass highly suspicious for neoplasm, 9.2 x 8.4 x 5.1 cm in size, hypervascular and containing a few microcalcifications.  Additional subcutaneous nodule is seen which may represent a additional tumor or metastatic lymph node 2.7 x 1.7 x 2.3 cm in size.  Patient had LEFT inguinal and external iliac adenopathy by preceding CT.  Since the origin and deep extent of this lesion are not well visualized, recommend MR imaging with and without contrast to further characterize.   Electronically Signed   By: Lavonia Dana M.D.   On: 05/19/2015 17:20   Dg Chest Port 1 View  05/19/2015   CLINICAL DATA:  Sepsis.  EXAM: PORTABLE CHEST - 1 VIEW  COMPARISON:  Chest CT 04/20/2015  FINDINGS: Left upper lobe pulmonary nodule is less well-defined than on prior exam. Additional pulmonary nodules on prior CT are not definitively seen. Heart size and mediastinal contours are unchanged. No confluent airspace disease to suggest pneumonia. No pleural effusion or pneumothorax.  IMPRESSION: 1.  No acute pulmonary process. 2. Left upper lobe pulmonary nodule, as seen on recent chest CT. Additional pulmonary nodules on CT are not seen radiographically.   Electronically Signed   By: Jeb Levering M.D.   On: 05/19/2015 05:38     Admission HPI:   Carly Schwartz is a 69 year old lady with PMH of HTN, presenting with fatigue, weakness, and UTI. She was recently admitted to [facility] on 8/14, complaining of fatigue, dizziness, and falls. She was found to be orthostatic and suffering from a UTI. UA at that time was positive for  leukocytes and nitrites and many bacteria. However, urine culture grew multiple species c/f contamination. She was given CTX and discharged with 10 day course of Cipro on 8/16. Today, she reports resolution of her UTI symptoms following completion of Cipro. She denies dysuria, urgency, frequency, or back pain. She endorses mild lower abdominal tenderness. She endorses feeling the same way she did prior to her last admission.  For the last two weeks, she reports decreasing appetite. She has lost about 18 pounds over the last 2-3 months. She denies night sweats. She states food just does not appeal to her and she is not eating. She reports good PO fluid intake with water. However, for the last two days, she reports N/V and diarrhea. She states the diarrhea is loose, watery, brown, foul smelling stool. She says she has about 2 diarrheal BMs per day. She denies blood in her vomit or stool. She does reports dizziness with standing.   In the ED, she was hypotensive, with moderate response to IVF boluses. Her WBC count was elevated to 23, with lactic acid of 2.87. CT abdomen/pelvis demonstrated colitis. She was given one dose CTX IV and 2L NS.   I/O cath for UA sample failed due to large, fungating, foul smelling vaginal mass. Patient reports first noticing the mass today when being evaluated in the ED. She denies vaginal bleeding or other discharge, but endorses blood in her urine and when wiping for the last 2-3 months. She endorses vaginal pain only with manipulation of the mass. She believes it has been 10+ years since her last Pap smear. She denies ever having an abnormal Pap smear in the past. Of note, CT chest on previous admission (8/16) noted multiple bilateral pulmonary nodules c/f malignancy. PET-CT was recommended but has not been performed. CT abdomen/pelvis today (9/13) also noted left external and iliac adenopathy c/f metastatic disease.   She denies fever, chills,  chest pain, or SOB.  She has a 15 pack-year smoking history, but stopped smoking 3 months ago.   Hospital Course by problem list: Active Problems:   Shock   Sepsis   Vulvar mass   Vaginal mass   Pelvic mass   Septic shock   Carly Schwartz is a 69 year old lady with PMH of HTN, presenting with sepsis and vaginal mass.   Severe sepsis 2/2 to Colitis - improving  on admission patient had 2/4 SIRS criteria and was hypotensive, with adequate response to IV fluids. She was recently admitted for UTI and treated with ceftriaxone and ciprofloxacin (no bug isolated). Initially concern for recurrent UTI in the setting of  urethral obstruction from vaginal mass. However, UA at admission with small amount leukocytes and negative nitrites. CT scan results with colonic wall thickening from the splenic flexure to the junction of the descending colon to sigmoid colon likely representing an acute colitis of infectious or inflammatory nature.  -was on zosyn then we transitioned to  Ciprofloxacin 500mg  PO bid and Metronidazole 500mg  PO q6h for colitis coverage. She remained afebrile on this and continued to feel better. Will cont cipro and flagyl PO to complete course on 05/27/15 for total 10 days.  - BP is likely low at baseline, her BP stayed in 80-90's on fluid and also without fluid. She is not symptomatic from this. Will not treat this BP as this is likely her baseline.  -blood cultures x 2 NGTD - zofran for nausea. Will send home with some zofran  Vulvar Mass: Large, fungating mass protruding from vagina. Decreased appetite and 18 lb weight loss. As patient has local adenopathy and pulmonary nodules, she is likely Stage IV -consult Gyn Onc. Appreciate their assistance. S/p biopsy. Awaiting path results.  - Dr. Denman George will arrange outpatient follow up for the patient next week at which time biopsy and PET results should be available  Anemia - baseline 9-10. Now 8's. Mild drop can be from  biopsy + blood draws + fluid - follow up Anemia panel collected on day of discharge.   Dispo: home today. Declined HHPT. Ordered rolling walker. Will setup appt at [facility] center for outpatient follow up and Dr. Serita Grit office will call patient for [facility] follow up.  Discharge Vitals:   BP 82/49 mmHg  Pulse 80  Temp(Src) 98 F (36.7 C) (Oral)  Resp 22  Ht 5\' 2"  (1.575 m)  Wt 115 lb 15.4 oz (52.6 kg)  BMI 21.20 kg/m2  SpO2 94%  Discharge Labs:  Results for orders placed or performed during the hospital encounter of 05/18/15 (from the past 24 hour(s))  CBC with Differential/Platelet     Status: Abnormal   Collection Time: 05/22/15 11:20 AM  Result Value Ref Range   WBC 12.3 (H) 4.0 - 10.5 K/uL   RBC 2.93 (L) 3.87 - 5.11 MIL/uL   Hemoglobin 8.7 (L) 12.0 - 15.0 g/dL   HCT 27.3 (L) 36.0 - 46.0 %   MCV 93.2 78.0 - 100.0 fL   MCH 29.7 26.0 - 34.0 pg   MCHC 31.9 30.0 - 36.0 g/dL   RDW 15.3 11.5 - 15.5 %   Platelets 191 150 - 400 K/uL   Neutrophils Relative % 83 %   Neutro Abs 10.1 (H) 1.7 - 7.7 K/uL   Lymphocytes Relative 8 %   Lymphs Abs 1.0 0.7 - 4.0 K/uL   Monocytes Relative 6 %   Monocytes Absolute 0.8 0.1 - 1.0 K/uL   Eosinophils Relative 3 %   Eosinophils Absolute 0.4 0.0 - 0.7 K/uL   Basophils Relative 0 %   Basophils Absolute 0.0 0.0 - 0.1 K/uL  Reticulocytes     Status: Abnormal   Collection Time: 05/22/15 12:50 PM  Result Value Ref Range   Retic Ct Pct 1.9 0.4 - 3.1 %   RBC. 2.78 (L) 3.87 - 5.11 MIL/uL   Retic Count, Manual 52.8 19.0 - 186.0 K/uL    Signed: Dellia Nims, MD 05/22/2015, 2:10 PM    Services Ordered on Discharge: declined home health PT Equipment Ordered on Discharge: rolling walker

## 2015-05-22 NOTE — Discharge Instructions (Signed)
Please complete your 10 day antibiotic dose on 05/27/15.  Make sure you follow up at our Internal Medicine clinic and also with Dr. Serita Grit clinic.  Make sure you are drinking enough water at least 64 ounce to keep yourself hydrated.

## 2015-05-22 NOTE — Progress Notes (Signed)
  Date: 05/22/2015  Patient name: Carly Schwartz  Medical record number: [id number]  Date of birth: August 21, 1946   This patient's plan of care was discussed with the house staff. Please see DR. Ahmed's note for complete details. I concur with his findings.  Patient being discharged today.  She has ambulated without dizziness or syncope.  She has refused home health PT.  Close follow up in the outpatient setting is needed.    Sid Falcon, MD 05/22/2015, 9:55 PM

## 2015-05-23 LAB — CULTURE, BLOOD (ROUTINE X 2)
CULTURE: NO GROWTH
Culture: NO GROWTH

## 2015-05-24 ENCOUNTER — Other Ambulatory Visit: Payer: Self-pay | Admitting: Gynecologic Oncology

## 2015-05-24 ENCOUNTER — Telehealth: Payer: Self-pay | Admitting: *Deleted

## 2015-05-24 ENCOUNTER — Telehealth: Payer: Self-pay | Admitting: Gynecologic Oncology

## 2015-05-24 DIAGNOSIS — R918 Other nonspecific abnormal finding of lung field: Secondary | ICD-10-CM

## 2015-05-24 DIAGNOSIS — C519 Malignant neoplasm of vulva, unspecified: Secondary | ICD-10-CM

## 2015-05-24 NOTE — Progress Notes (Signed)
PET ordered inpt but not performed.  PET reordered in the outpt setting since pt has been discharged.  Pt to see Dr. Denman George on 9/23.  Newly diagnosed vulvar squamous cell carcinoma.  Pulmonary nodules seen on previous CT of the chest.  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

## 2015-05-24 NOTE — Telephone Encounter (Signed)
Message left for patient with upcoming appt with Dr. Denman George on Sept 23 at 10:30am.  Advised to call the office to confirm that she received this message

## 2015-05-24 NOTE — Telephone Encounter (Signed)
Pt called , appt for 9/23 given to pt.  Pt verbalized and confirmed date time of appt.

## 2015-05-28 ENCOUNTER — Encounter: Payer: Self-pay | Admitting: Gynecologic Oncology

## 2015-05-28 ENCOUNTER — Ambulatory Visit: Payer: Medicare Other | Attending: Gynecologic Oncology | Admitting: Gynecologic Oncology

## 2015-05-28 VITALS — BP 100/62 | HR 116 | Temp 97.5°F | Resp 22 | Ht 62.0 in | Wt 103.2 lb

## 2015-05-28 DIAGNOSIS — E46 Unspecified protein-calorie malnutrition: Secondary | ICD-10-CM | POA: Diagnosis not present

## 2015-05-28 DIAGNOSIS — R102 Pelvic and perineal pain: Secondary | ICD-10-CM | POA: Diagnosis not present

## 2015-05-28 DIAGNOSIS — R630 Anorexia: Secondary | ICD-10-CM | POA: Diagnosis not present

## 2015-05-28 DIAGNOSIS — C519 Malignant neoplasm of vulva, unspecified: Secondary | ICD-10-CM | POA: Diagnosis not present

## 2015-05-28 MED ORDER — TRAMADOL HCL 50 MG PO TABS: 50.0000 mg | ORAL_TABLET | Freq: Four times a day (QID) | ORAL | Status: DC | PRN

## 2015-05-28 MED ORDER — MIRTAZAPINE 15 MG PO TABS: 15.0000 mg | ORAL_TABLET | Freq: Every day | ORAL | Status: AC

## 2015-05-28 NOTE — Patient Instructions (Addendum)
We will contact you with the results of the PET scan.  Plan to meet with Dr. Sondra Come in Radiation Oncology at the [facility] on Sept 28 at 3:30pm.  Please call for any questions or concerns.   Mirtazapine tablets What is this medicine? MIRTAZAPINE (mir TAZ a peen) is used to treat depression. This medicine may be used for other purposes; ask your health care provider or pharmacist if you have questions. COMMON BRAND NAME(S): Remeron What should I tell my health care provider before I take this medicine? They need to know if you have any of these conditions: -bipolar disorder -glaucoma -kidney disease -liver disease -suicidal thoughts -an unusual or allergic reaction to mirtazapine, other medicines, foods, dyes, or preservatives -pregnant or trying to get pregnant -breast-feeding How should I use this medicine? Take this medicine by mouth with a glass of water. Follow the directions on the prescription label. Take your medicine at regular intervals. Do not take your medicine more often than directed. Do not stop taking this medicine suddenly except upon the advice of your doctor. Stopping this medicine too quickly may cause serious side effects or your condition may worsen. A special MedGuide will be given to you by the pharmacist with each prescription and refill. Be sure to read this information carefully each time. Talk to your pediatrician regarding the use of this medicine in children. Special care may be needed. Overdosage: If you think you have taken too much of this medicine contact a poison control center or emergency room at once. NOTE: This medicine is only for you. Do not share this medicine with others. What if I miss a dose? If you miss a dose, take it as soon as you can. If it is almost time for your next dose, take only that dose. Do not take double or extra doses. What may interact with this medicine? Do not take this medicine with any of the following  medications: -linezolid -MAOIs like Carbex, Eldepryl, Marplan, Nardil, and Parnate -methylene blue (injected into a vein) This medicine may also interact with the following medications: -alcohol -antiviral medicines for HIV or AIDS -certain medicines that treat or prevent blood clots like warfarin -certain medicines for depression, anxiety, or psychotic disturbances -certain medicines for fungal infections like ketoconazole and itraconazole -certain medicines for migraine headache like almotriptan, eletriptan, frovatriptan, naratriptan, rizatriptan, sumatriptan, zolmitriptan -certain medicines for seizures like carbamazepine or phenytoin -certain medicines for sleep -cimetidine -erythromycin -fentanyl -lithium -medicines for blood pressure -nefazodone -rasagiline -rifampin -supplements like St. John's wort, kava kava, valerian -tramadol -tryptophan This list may not describe all possible interactions. Give your health care provider a list of all the medicines, herbs, non-prescription drugs, or dietary supplements you use. Also tell them if you smoke, drink alcohol, or use illegal drugs. Some items may interact with your medicine. What should I watch for while using this medicine? Tell your doctor if your symptoms do not get better or if they get worse. Visit your doctor or health care professional for regular checks on your progress. Because it may take several weeks to see the full effects of this medicine, it is important to continue your treatment as prescribed by your doctor. Patients and their families should watch out for new or worsening thoughts of suicide or depression. Also watch out for sudden changes in feelings such as feeling anxious, agitated, panicky, irritable, hostile, aggressive, impulsive, severely restless, overly excited and hyperactive, or not being able to sleep. If this happens, especially at the beginning of  treatment or after a change in dose, call your health  care professional. Dennis Bast may get drowsy or dizzy. Do not drive, use machinery, or do anything that needs mental alertness until you know how this medicine affects you. Do not stand or sit up quickly, especially if you are an older patient. This reduces the risk of dizzy or fainting spells. Alcohol may interfere with the effect of this medicine. Avoid alcoholic drinks. This medicine may cause dry eyes and blurred vision. If you wear contact lenses you may feel some discomfort. Lubricating drops may help. See your eye doctor if the problem does not go away or is severe. Your mouth may get dry. Chewing sugarless gum or sucking hard candy, and drinking plenty of water may help. Contact your doctor if the problem does not go away or is severe. What side effects may I notice from receiving this medicine? Side effects that you should report to your doctor or health care professional as soon as possible: -allergic reactions like skin rash, itching or hives, swelling of the face, lips, or tongue -breathing problems -confusion -fever, sore throat, or mouth ulcers or blisters -flu like symptoms including fever, chills, cough, muscle or joint aches and pains -stomach pain with nausea and/or vomiting -suicidal thoughts or other mood changes -swelling of the hands or feet -unusual bleeding or bruising -unusually weak or tired -vomiting Side effects that usually do not require medical attention (report to your doctor or health care professional if they continue or are bothersome): -constipation -increased appetite -weight gain This list may not describe all possible side effects. Call your doctor for medical advice about side effects. You may report side effects to FDA at 1-800-FDA-1088. Where should I keep my medicine? Keep out of the reach of children. Store at room temperature between 15 and 30 degrees C (59 and 86 degrees F) Protect from light and moisture. Throw away any unused medicine after the  expiration date. NOTE: This sheet is a summary. It may not cover all possible information. If you have questions about this medicine, talk to your doctor, pharmacist, or health care provider.  2015, Elsevier/Gold Standard. (2013-03-14 13:11:19)   Tramadol tablets What is this medicine? TRAMADOL (TRA ma dole) is a pain reliever. It is used to treat moderate to severe pain in adults. This medicine may be used for other purposes; ask your health care provider or pharmacist if you have questions. COMMON BRAND NAME(S): Ultram What should I tell my health care provider before I take this medicine? They need to know if you have any of these conditions: -brain tumor -depression -drug abuse or addiction -head injury -if you frequently drink alcohol containing drinks -kidney disease or trouble passing urine -liver disease -lung disease, asthma, or breathing problems -seizures or epilepsy -suicidal thoughts, plans, or attempt; a previous suicide attempt by you or a family member -an unusual or allergic reaction to tramadol, codeine, other medicines, foods, dyes, or preservatives -pregnant or trying to get pregnant -breast-feeding How should I use this medicine? Take this medicine by mouth with a full glass of water. Follow the directions on the prescription label. If the medicine upsets your stomach, take it with food or milk. Do not take more medicine than you are told to take. Talk to your pediatrician regarding the use of this medicine in children. Special care may be needed. Overdosage: If you think you have taken too much of this medicine contact a poison control center or emergency room at once. NOTE: This  medicine is only for you. Do not share this medicine with others. What if I miss a dose? If you miss a dose, take it as soon as you can. If it is almost time for your next dose, take only that dose. Do not take double or extra doses. What may interact with this medicine? Do not take  this medicine with any of the following medications: -MAOIs like Carbex, Eldepryl, Marplan, Nardil, and Parnate This medicine may also interact with the following medications: -alcohol or medicines that contain alcohol -antihistamines -benzodiazepines -bupropion -carbamazepine or oxcarbazepine -clozapine -cyclobenzaprine -digoxin -furazolidone -linezolid -medicines for depression, anxiety, or psychotic disturbances -medicines for migraine headache like almotriptan, eletriptan, frovatriptan, naratriptan, rizatriptan, sumatriptan, zolmitriptan -medicines for pain like pentazocine, buprenorphine, butorphanol, meperidine, nalbuphine, and propoxyphene -medicines for sleep -muscle relaxants -naltrexone -phenobarbital -phenothiazines like perphenazine, thioridazine, chlorpromazine, mesoridazine, fluphenazine, prochlorperazine, promazine, and trifluoperazine -procarbazine -warfarin This list may not describe all possible interactions. Give your health care provider a list of all the medicines, herbs, non-prescription drugs, or dietary supplements you use. Also tell them if you smoke, drink alcohol, or use illegal drugs. Some items may interact with your medicine. What should I watch for while using this medicine? Tell your doctor or health care professional if your pain does not go away, if it gets worse, or if you have new or a different type of pain. You may develop tolerance to the medicine. Tolerance means that you will need a higher dose of the medicine for pain relief. Tolerance is normal and is expected if you take this medicine for a long time. Do not suddenly stop taking your medicine because you may develop a severe reaction. Your body becomes used to the medicine. This does NOT mean you are addicted. Addiction is a behavior related to getting and using a drug for a non-medical reason. If you have pain, you have a medical reason to take pain medicine. Your doctor will tell you how much  medicine to take. If your doctor wants you to stop the medicine, the dose will be slowly lowered over time to avoid any side effects. You may get drowsy or dizzy. Do not drive, use machinery, or do anything that needs mental alertness until you know how this medicine affects you. Do not stand or sit up quickly, especially if you are an older patient. This reduces the risk of dizzy or fainting spells. Alcohol can increase or decrease the effects of this medicine. Avoid alcoholic drinks. You may have constipation. Try to have a bowel movement at least every 2 to 3 days. If you do not have a bowel movement for 3 days, call your doctor or health care professional. Your mouth may get dry. Chewing sugarless gum or sucking hard candy, and drinking plenty of water may help. Contact your doctor if the problem does not go away or is severe. What side effects may I notice from receiving this medicine? Side effects that you should report to your doctor or health care professional as soon as possible: -allergic reactions like skin rash, itching or hives, swelling of the face, lips, or tongue -breathing difficulties, wheezing -confusion -itching -light headedness or fainting spells -redness, blistering, peeling or loosening of the skin, including inside the mouth -seizures Side effects that usually do not require medical attention (report to your doctor or health care professional if they continue or are bothersome): -constipation -dizziness -drowsiness -headache -nausea, vomiting This list may not describe all possible side effects. Call your doctor for medical advice  about side effects. You may report side effects to FDA at 1-800-FDA-1088. Where should I keep my medicine? Keep out of the reach of children. Store at room temperature between 15 and 30 degrees C (59 and 86 degrees F). Keep container tightly closed. Throw away any unused medicine after the expiration date. NOTE: This sheet is a summary. It  may not cover all possible information. If you have questions about this medicine, talk to your doctor, pharmacist, or health care provider.  2015, Elsevier/Gold Standard. (2010-05-04 11:55:44)

## 2015-05-28 NOTE — Progress Notes (Signed)
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  1/ PET scan to evaluate pulmonary nodules.  2/ referral to medical oncology for consideration of cisplatin and paclitaxel (if these pulmonary nodules are avid). 3/ referral to radiation oncology for palliative intent radiation to the vulva to control symptoms of pain and immanent urinary obstruction regardless of status of chest on imaging. If her chest disease is non-avid, radiation should be curative intent and would need to include the inguinal nodal region (after debulking of the nodes surgically) 4/ if the PET is negative in the chest and her disease is confined to the vulva and nodal, we could contemplate curative intent primary chemoradiation, and this should include a surgical debulking of the left inguinal adenopathy. 50 Remeron prescribed for sleep disturbance and anorexia.  HPI: Carly Schwartz is a 69 year old female who recently presented to the ED with fatigue, weakness, dizziness, difficulty urinating, diarrhea, and recent UTI. She was admitted for sepsis, questionable recurrent UTI, colitis on CT with WBC 23.2. During this admission, she was also noted to have a fungating vaginal mass when an I&O cath attempt was unsuccessful due to obstruction from the mass. CT scan on 9/13 resulting:Colonic wall thickening from the splenic flexure to the junction of the descending colon to sigmoid colon likely representing an acute colitis of infectious or inflammatory nature. Mild mucosal enhancement of several small bowel loops in the pelvis with engorgement of the vasa recta as findings may be due to a regional enteritis although together with the colonic findings could be seen in inflammatory bowel disease due to Crohn's. Abnormal left external iliac and inguinal adenopathy suggesting metastatic disease.  Recommend clinical correlation and PET scan versus tissue diagnosis via biopsy of this left inguinal node. Moderate size hiatal hernia unchanged. Based on the results, there was a concern for c diff due to recent cipro use and colitis seen on scan. During her previous admission in August 2016, CT of the chest noted several pulmonary nodules stating metastatic disease cannot be excluded.   She states she did not know the vulvar mass was present until it was noted at the hospital admission. She was having difficulty urinating when she came to the hospital but states her bladder was "doing good" before August. The vulvar mass had "not been a bother at home" and she did not notice it.   Biopsy of the vulva on 05/20/15 revealed squamous cell carcinoma.  She has been experiencing a decreased appetite x 1 week with dry heaves as well. Positive for early satiety.Positive for anorexia  She has experienced weight loss.  Her gynecologic history includes G5 P5. She states she saw a gynecologist "maybe 5 years ago" with her pap smear being at least ten years ago. Her children were all born in a hospital vaginally but she denies having prenatal care. No history of abnormal pap smears to her knowledge. Denies use of BCPs or hormone therapy. She denies a history of fibroids or endometriosis. She had a tubal ligation in the past and denies any other surgeries. She reports having a mammogram 5 to 6 years ago and denies ever having a colonoscopy.   Review of Systems  Constitutional: Positive for early satiety and decreased appetite. Unsure if dizziness/weakness has resolved since she has not been out of bed.  Cardiovascular: No chest pain, shortness of breath, or edema.  Pulmonary: No cough or  wheeze. No hemoptysis.  Gastrointestinal: No nausea, vomiting, or diarrhea at this time. Loose stools noted prior to admission. No bright red blood per rectum or change in bowel movement.   Genitourinary: No frequency, urgency, or dysuria. Positive for vaginal spotting and discharge.  Musculoskeletal: No myalgia or joint pain. Neurologic: Positive for weakness on admission. No numbness or change in gait.  Psychology: No depression, anxiety, or insomnia.  Current Meds:  Current facility-administered medications:  . acetaminophen (TYLENOL) tablet 650 mg, 650 mg, Oral, Q6H PRN **OR** acetaminophen (TYLENOL) suppository 650 mg, 650 mg, Rectal, Q6H PRN, Alexa Sherral Hammers, MD . Derrill Memo ON 05/20/2015] enoxaparin (LOVENOX) injection 30 mg, 30 mg, Subcutaneous, Q24H, Nischal Narendra, MD . feeding supplement (BOOST / RESOURCE BREEZE) liquid 1 Container, 1 Container, Oral, BID BM, Ardeen Garland, RD, 1 Container at 05/19/15 1030 . lactated ringers infusion, , Intravenous, Continuous, Alexa Sherral Hammers, MD, Last Rate: 125 mL/hr at 05/19/15 0743 . ondansetron (ZOFRAN) tablet 4 mg, 4 mg, Oral, Q6H PRN **OR** ondansetron (ZOFRAN) injection 4 mg, 4 mg, Intravenous, Q6H PRN, Alexa Sherral Hammers, MD . piperacillin-tazobactam (ZOSYN) IVPB 3.375 g, 3.375 g, Intravenous, Q8H, Veronda P Bryk, RPH, 3.375 g at 05/19/15 1321 . sodium chloride 0.9 % bolus 1,000 mL, 1,000 mL, Intravenous, Once, Alexa Sherral Hammers, MD, 1,000 mL at 05/19/15 0415 . sodium chloride 0.9 % injection 3 mL, 3 mL, Intravenous, Q12H, Alexa Sherral Hammers, MD, 0 mL at 05/19/15 1000 . vancomycin (VANCOCIN) 50 mg/mL oral solution 500 mg, 500 mg, Oral, 4 times per day, Alexa Sherral Hammers, MD, 500 mg at 05/19/15 1321  Allergy: No Known Allergies  Social Hx:  Social History   Social History  . Marital Status: Single    Spouse Name: N/A  . Number of Children: N/A  . Years of Education: N/A   Occupational History  . Not on file.   Social History Main Topics  . Smoking status: Former Smoker -- 1.00 packs/day for 15 years    Types: Cigarettes    Quit date: 02/16/2015  .  Smokeless tobacco: Not on file  . Alcohol Use: No  . Drug Use: No  . Sexual Activity: No   Other Topics Concern  . Not on file   Social History Narrative  She had smoked one pack a day for 16 years and recently stopped three months ago.   Past Surgical Hx:  Past Surgical History  Procedure Laterality Date  . Tubal ligation      Past Medical Hx:  Past Medical History  Diagnosis Date  . Hypertension     Family Hx: She had 3 daughters, 2 living, and two sons. Her oldest daughter passed away for Hodgkins lymphoma and was diagnosed at 48. Her mother had ovarian cancer and passed away when the patient was in high school. Her father passed away from coal mine dust exposure. She had two brothers with the oldest deceased from "his ex wife tearing him apart" and her younger brother currently lives in [place].   Vitals: Blood pressure 111/75, pulse 95, temperature 97.5 F (36.4 C), temperature source Oral, resp. rate 16, height 5\' 2"  (1.575 m), weight 115 lb 15.4 oz (52.6 kg), SpO2 98 %.  Physical Exam:  General: Frail female in no acute distress. Alert and oriented x 3.  Cardiovascular: Regular rate and rhythm. S1 and S2 normal.  Lungs: Clear to auscultation bilaterally. No wheezes/crackles/rhonchi noted.  Skin: No rashes or lesions present except for vulvar mass.  Lymph nodes: no  palpable cervical adenoapthy. Large (6cm) left inguinal lymph node. Abdomen: Abdomen soft, non-tender and non-obese. Active bowel sounds in all quadrants. No evidence of a fluid wave or abdominal masses.  Genitourinary:   Vulva/vagina: Large, fungating vulvar mass measuring around 9 cm extending from above the urethra to posterior to the anus. Easily friable and firm. Mass obstructing the view of the anus. Tender with manipulation. 2.5cm lesion noted on the left labia majora as well.   Urethra: Urethra visualized behind portion of the mass with  no lesions or masses involving the urethra.   Vagina: One finger inserted in the vagina. Nodularity noted around the introitus and lower portion of the vaginal tissues. No nodularity noted at the top of the vagina. Cervix smooth and midline. No cervical motion tenderness. Uterus mobile. Exam ended due to moderate discomfort. Palpable left inguinal node.  Rectal: Unable to identify anus for rectal exam due to mass and pain with manipulation.  Extremities: No bilateral cyanosis, edema, or clubbing.  Soft tissue ultrasound being performed upon entry into the patient's room.       Donaciano Eva, MD

## 2015-05-31 ENCOUNTER — Ambulatory Visit: Payer: Medicare Other | Admitting: Gynecologic Oncology

## 2015-05-31 ENCOUNTER — Encounter: Payer: Self-pay | Admitting: Internal Medicine

## 2015-05-31 ENCOUNTER — Ambulatory Visit (INDEPENDENT_AMBULATORY_CARE_PROVIDER_SITE_OTHER): Payer: Medicare Other | Admitting: Internal Medicine

## 2015-05-31 VITALS — BP 84/52 | HR 85 | Temp 97.0°F | Wt 101.2 lb

## 2015-05-31 DIAGNOSIS — C519 Malignant neoplasm of vulva, unspecified: Secondary | ICD-10-CM

## 2015-05-31 DIAGNOSIS — Z09 Encounter for follow-up examination after completed treatment for conditions other than malignant neoplasm: Secondary | ICD-10-CM | POA: Diagnosis not present

## 2015-05-31 DIAGNOSIS — E46 Unspecified protein-calorie malnutrition: Secondary | ICD-10-CM | POA: Diagnosis not present

## 2015-05-31 DIAGNOSIS — Z87891 Personal history of nicotine dependence: Secondary | ICD-10-CM

## 2015-05-31 DIAGNOSIS — I9589 Other hypotension: Secondary | ICD-10-CM | POA: Diagnosis not present

## 2015-05-31 NOTE — Patient Instructions (Signed)
Please try to drink fluids as tolerated. If you feel like you can eat, feel free to eat whatever you are able.  Please try to take your medication, Remeron, this will help with your appetite.  Please continue to follow up with your scheduled appointments.  I have prescribed a walker with a seat that we will try to have delivered to your house.

## 2015-05-31 NOTE — Assessment & Plan Note (Signed)
Patient hypotensive with BP 84/52 today. This may be near her baseline, but she reports not eating or drinking much since discharge from hospital due to low appetite. She does endorse lightheadedness when standing and trying to walk, likely secondary to dehydration. Unable to obtain orthostatics today as patient has difficulty with sitting and standing due to pain, though I would expect this to be positive. Discussed with patient the importance of fluid intake to stay hydrated, improve her lightheadedness, and to avoid falls. She was offered and encouraged to have IV fluid hydration in the ED today, but declined and states she will try to increase her fluid intake at home. -Continue Remeron for appetite -Increase fluid intake as tolerated -Ordered rolling walker with seat for home use to help avoid falls -Patient advised to call us or go to ED should her symptoms not improve or worsen

## 2015-05-31 NOTE — Assessment & Plan Note (Signed)
Patient with Stage IV vulvar squamous cell carcinoma per biopsy on 05/20/15. She follows with Dr. Roanna Raider Onc. Patient scheduled for PET on 9/27 and Rad Onc appointment on 9/28. She states that she has difficulty ambulating and sitting due to pain and only able to get around with help of boyfriend. -Continue to follow with oncology, for which we are appreciative -Ordered rolling walker with seat for home use

## 2015-05-31 NOTE — Assessment & Plan Note (Signed)
Patient seen for follow up after hospital admission for sepsis 2/2 to colitis from 9/13-9/17/16. Patient was prescribed Ciprofloxacin 500 mg BID and Flagyl 500 mg q6h on hospital discharge to complete a total 10 day course with last day being on 05/27/15. Patient states that she did not finish full course as she was unable to tolerate with GI symptoms of nausea and upset stomach. She reports having 1 pill of Cipro and 6 pills of Flagyl remaining, putting her about 1-1.5 days short of full course. She denies any fever, chills, N/V/D/C, or other symptoms that had brought her to the hospital. -Advised patient that since she discontinued the antibiotics due to intolerance and was near completion of full 10 day course, she does not need to finish remaining pills and we hope that she the amount she did take has provided adequate coverage

## 2015-05-31 NOTE — Assessment & Plan Note (Signed)
Patient with low appetite, malnourished, and 14 lb weight loss in the last 6 weeks. She was started on Remeron by Dr. Denman George for appetite and depression, but states she has not taken yet. She will eat small amounts of jello and is drinking very low volumes of fluid. She denies any dysphagia, N/V/D/C, or abdominal pain. She does endorse food cravings and is encouraged to eat whatever she likes as well as to try to drink plenty of fluids. -Continue Remeron, patient states she will start taking -Patient states she will try to drink fluids as tolerated and eat what she can

## 2015-05-31 NOTE — Progress Notes (Signed)
Patient ID: Carly Schwartz, female   DOB: [birth year], 69 y.o.   MRN: [id number]   Subjective:   Patient ID: Carly Schwartz female   DOB: 02-10-1946 69 y.o.   MRN: [id number]  HPI: Ms.Carly Schwartz is a 69 y.o. female with PMH of HTN who presents for hospital follow up after admission for septic shock 2/2 colitis. She was also found to have a vulvar mass during hospital stay with biopsy showing squamous cell carcinoma. She is following Gyn Onc for workup with PET scheduled 06/01/15 and appointment with Rad Onc on 06/02/15. She is accompanied by her boyfriend and granddaughter.  Patient does endorse lightheadedness and very low appetite.  Please see problem list for status of chronic medical conditions.  Past Medical History  Diagnosis Date  . Hypertension    Current Outpatient Prescriptions  Medication Sig Dispense Refill  . ciprofloxacin (CIPRO) 500 MG tablet Take 500 mg by mouth 2 (two) times daily.    . feeding supplement (BOOST / RESOURCE BREEZE) LIQD Take 1 Container by mouth 2 (two) times daily between meals. 60 Container 0  . metroNIDAZOLE (FLAGYL) 500 MG tablet Take 1 tablet (500 mg total) by mouth every 6 (six) hours. (Patient not taking: Reported on 05/31/2015) 22 tablet 0  . mirtazapine (REMERON) 15 MG tablet Take 1 tablet (15 mg total) by mouth at bedtime. 30 tablet 2  . ondansetron (ZOFRAN) 4 MG tablet Take 1 tablet (4 mg total) by mouth every 6 (six) hours as needed for nausea. 20 tablet 0  . traMADol (ULTRAM) 50 MG tablet Take 1 tablet (50 mg total) by mouth every 6 (six) hours as needed for severe pain. 30 tablet 0   No current facility-administered medications for this visit.   No family history on file. Social History   Social History  . Marital Status: Single    Spouse Name: N/A  . Number of Children: N/A  . Years of Education: N/A   Social History Main Topics  . Smoking status: Former Smoker -- 1.00 packs/day for 15 years    Types: Cigarettes    Quit date:  02/16/2015  . Smokeless tobacco: None  . Alcohol Use: No  . Drug Use: No  . Sexual Activity: No   Other Topics Concern  . None   Social History Narrative   Review of Systems: Review of Systems  Constitutional: Positive for weight loss and malaise/fatigue. Negative for fever, chills and diaphoresis.  Respiratory: Positive for cough and shortness of breath. Negative for hemoptysis, sputum production and wheezing.   Cardiovascular: Negative for chest pain, palpitations, orthopnea and leg swelling.  Gastrointestinal: Negative for nausea, vomiting, abdominal pain, diarrhea, constipation, blood in stool and melena.       Decreased appetite.  Genitourinary: Negative for dysuria.  Musculoskeletal: Negative for joint pain and falls.  Skin: Negative for rash.  Neurological: Positive for dizziness and weakness. Negative for tingling and headaches.  Endo/Heme/Allergies: Bruises/bleeds easily.    Objective:  Physical Exam: Filed Vitals:   05/31/15 1420 05/31/15 1422  BP: 84/52   Pulse: 130 85  Temp: 97 F (36.1 C)   TempSrc: Oral   Weight: 101 lb 3.2 oz (45.904 kg)   SpO2: 97% 94%   Physical Exam  Constitutional: She is oriented to person, place, and time. No distress.  Thin, elderly woman, laying in reclining chair.  Cardiovascular: Normal rate and regular rhythm.   No murmur heard. Pulmonary/Chest: Effort normal. No respiratory distress.  Minimal wheezing heard anteriorly  at right upper chest, clear to auscultation posteriorly.  Abdominal: Soft. Bowel sounds are normal. She exhibits no distension. There is no tenderness. There is no guarding.  Musculoskeletal: She exhibits no edema or tenderness.  Thin, cachectic extremities. Exam limited due to pain from vulvar mass, has difficulty sitting and standing.  Neurological: She is alert and oriented to person, place, and time.  Skin: Skin is warm.    Assessment & Plan:  Please see problem based charting for assessment and  plan.

## 2015-05-31 NOTE — Progress Notes (Signed)
69 year old female coming in for hospital follow up visit [facility] stay :Septic shock secondary to colitis) also found to have a vulvar mass (SCC) for which she is undergoing work up right now. Patient currently has low BP and dizziness secondary to poor PO intake. Orthostatics cannot be done as the patient cannot sit because of the painful mass. However, there is high likeliehood of the orthostatic vitals being positive.  - Patient and family was given the option to be referred to the ER for IV fluids, which they refused presently. Patient was then advised to try increasing her oral fluid intake.  - A walker with a seat will be prescribed to the patient to prevent sudden falls due to dizziness.    Case seen and examined with Dr Posey Pronto, V. Plan discussed with Dr Posey Pronto, V. Madilyn Fireman MD MPH 05/31/2015 4:24 PM

## 2015-06-01 ENCOUNTER — Ambulatory Visit (HOSPITAL_COMMUNITY)
Admission: RE | Admit: 2015-06-01 | Discharge: 2015-06-01 | Disposition: A | Discharge status: Home / Self Care | Payer: Medicare Other | Source: Ambulatory Visit | Attending: Gynecologic Oncology | Admitting: Gynecologic Oncology

## 2015-06-01 ENCOUNTER — Encounter: Payer: Self-pay | Admitting: Radiation Oncology

## 2015-06-01 DIAGNOSIS — R531 Weakness: Secondary | ICD-10-CM | POA: Diagnosis not present

## 2015-06-01 DIAGNOSIS — R918 Other nonspecific abnormal finding of lung field: Secondary | ICD-10-CM

## 2015-06-01 DIAGNOSIS — C519 Malignant neoplasm of vulva, unspecified: Secondary | ICD-10-CM

## 2015-06-01 LAB — GLUCOSE, CAPILLARY: GLUCOSE-CAPILLARY: 81 mg/dL (ref 65–99)

## 2015-06-01 NOTE — Progress Notes (Signed)
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 Denman George "who recently presented to the ED with fatigue, weakness, dizziness, difficulty urinating, diarrhea, and recent UTI. She was admitted for sepsis, questionable recurrent UTI, colitis on CT with WBC 23.2. During this admission, she was also noted to have a fungating vaginal mass when an I&O cath attempt was unsuccessful due to obstruction from the mass."  Biopsies revealed:   05/20/15 Diagnosis Vulva, biopsy, Left vulvar mass - SQUAMOUS CELL CARCINOMA.  Past/Anticipated interventions by Gyn/Onc surgery, if any: Per Dr. Denman George "if the PET is negative in the chest and her disease is confined to the vulva and nodal, we could contemplate curative intent primary chemoradiation, and this should include a surgical debulking of the left inguinal adenopathy."  Past/Anticipated interventions by medical oncology, if any: cisplatin and paclitaxel (if these pulmonary nodules are avid).  Weight changes, if any: has lost 30 lbs over the last 6 months.  Bowel/Bladder complaints, if any: denies bladder issues, last bm this morning.  Nausea/Vomiting, if any: dry heaves last weak with antibiotics.  No nausea this week.  Pain issues, if any:  no  SAFETY ISSUES:  Prior radiation? no  Pacemaker/ICD? no  Possible current pregnancy? no  Is the patient on methotrexate? no  Current Complaints / other details:  PET scan 06/01/15.  Patient is here with her boyfriend.  She has 5 children.  She is very weak and can only stand for short times.  She reports feeling dizzy.  BP was 79/54.  She reports having vaginal bleeding this morning.  BP 79/54 mmHg  Pulse 125  Temp(Src) 97.6 F (36.4 C) (Axillary)  Resp 20  Ht 5\' 2"  (1.575 m)  Wt 93 lb 4.8 oz (42.321 kg)  BMI 17.06 kg/m2  SpO2 94%   Wt Readings from Last 3 Encounters:  06/02/15 93 lb 4.8 oz (42.321 kg)  05/31/15 101 lb 3.2 oz  (45.904 kg)  05/28/15 103 lb 3.2 oz (46.811 kg)

## 2015-06-02 ENCOUNTER — Ambulatory Visit
Admission: RE | Admit: 2015-06-02 | Discharge: 2015-06-02 | Disposition: A | Discharge status: Home / Self Care | Payer: Medicare Other | Source: Ambulatory Visit | Attending: Radiation Oncology | Admitting: Radiation Oncology

## 2015-06-02 ENCOUNTER — Encounter: Payer: Self-pay | Admitting: Radiation Oncology

## 2015-06-02 ENCOUNTER — Inpatient Hospital Stay (HOSPITAL_COMMUNITY)
Admission: EM | Admit: 2015-06-02 | Discharge: 2015-06-14 | DRG: 754 | Disposition: A | Discharge status: Skilled Nursing Facility | Payer: Medicare Other | Attending: Internal Medicine | Admitting: Internal Medicine

## 2015-06-02 ENCOUNTER — Encounter (HOSPITAL_COMMUNITY): Payer: Self-pay | Admitting: Emergency Medicine

## 2015-06-02 ENCOUNTER — Emergency Department (HOSPITAL_COMMUNITY): Payer: Medicare Other

## 2015-06-02 ENCOUNTER — Telehealth: Payer: Self-pay | Admitting: Oncology

## 2015-06-02 VITALS — BP 79/54 | HR 125 | Temp 97.6°F | Resp 20 | Ht 62.0 in | Wt 93.3 lb

## 2015-06-02 DIAGNOSIS — C771 Secondary and unspecified malignant neoplasm of intrathoracic lymph nodes: Secondary | ICD-10-CM | POA: Diagnosis present

## 2015-06-02 DIAGNOSIS — R0902 Hypoxemia: Secondary | ICD-10-CM

## 2015-06-02 DIAGNOSIS — N390 Urinary tract infection, site not specified: Secondary | ICD-10-CM | POA: Diagnosis present

## 2015-06-02 DIAGNOSIS — I950 Idiopathic hypotension: Secondary | ICD-10-CM | POA: Diagnosis not present

## 2015-06-02 DIAGNOSIS — J9601 Acute respiratory failure with hypoxia: Secondary | ICD-10-CM | POA: Diagnosis present

## 2015-06-02 DIAGNOSIS — F329 Major depressive disorder, single episode, unspecified: Secondary | ICD-10-CM | POA: Diagnosis present

## 2015-06-02 DIAGNOSIS — R197 Diarrhea, unspecified: Secondary | ICD-10-CM | POA: Diagnosis not present

## 2015-06-02 DIAGNOSIS — I959 Hypotension, unspecified: Secondary | ICD-10-CM | POA: Diagnosis present

## 2015-06-02 DIAGNOSIS — C519 Malignant neoplasm of vulva, unspecified: Secondary | ICD-10-CM

## 2015-06-02 DIAGNOSIS — D630 Anemia in neoplastic disease: Secondary | ICD-10-CM | POA: Diagnosis present

## 2015-06-02 DIAGNOSIS — R531 Weakness: Secondary | ICD-10-CM | POA: Diagnosis present

## 2015-06-02 DIAGNOSIS — I951 Orthostatic hypotension: Secondary | ICD-10-CM | POA: Diagnosis not present

## 2015-06-02 DIAGNOSIS — Z66 Do not resuscitate: Secondary | ICD-10-CM | POA: Diagnosis present

## 2015-06-02 DIAGNOSIS — D509 Iron deficiency anemia, unspecified: Secondary | ICD-10-CM | POA: Insufficient documentation

## 2015-06-02 DIAGNOSIS — K449 Diaphragmatic hernia without obstruction or gangrene: Secondary | ICD-10-CM | POA: Diagnosis present

## 2015-06-02 DIAGNOSIS — Z8049 Family history of malignant neoplasm of other genital organs: Secondary | ICD-10-CM

## 2015-06-02 DIAGNOSIS — R319 Hematuria, unspecified: Secondary | ICD-10-CM | POA: Diagnosis present

## 2015-06-02 DIAGNOSIS — A419 Sepsis, unspecified organism: Secondary | ICD-10-CM | POA: Diagnosis not present

## 2015-06-02 DIAGNOSIS — E860 Dehydration: Secondary | ICD-10-CM | POA: Diagnosis present

## 2015-06-02 DIAGNOSIS — B3749 Other urogenital candidiasis: Secondary | ICD-10-CM | POA: Diagnosis present

## 2015-06-02 DIAGNOSIS — E876 Hypokalemia: Secondary | ICD-10-CM | POA: Diagnosis not present

## 2015-06-02 DIAGNOSIS — Z79899 Other long term (current) drug therapy: Secondary | ICD-10-CM

## 2015-06-02 DIAGNOSIS — Z515 Encounter for palliative care: Secondary | ICD-10-CM | POA: Insufficient documentation

## 2015-06-02 DIAGNOSIS — Z681 Body mass index (BMI) 19 or less, adult: Secondary | ICD-10-CM | POA: Diagnosis not present

## 2015-06-02 DIAGNOSIS — G893 Neoplasm related pain (acute) (chronic): Secondary | ICD-10-CM

## 2015-06-02 DIAGNOSIS — Z7189 Other specified counseling: Secondary | ICD-10-CM

## 2015-06-02 DIAGNOSIS — C775 Secondary and unspecified malignant neoplasm of intrapelvic lymph nodes: Secondary | ICD-10-CM | POA: Diagnosis present

## 2015-06-02 DIAGNOSIS — C7800 Secondary malignant neoplasm of unspecified lung: Secondary | ICD-10-CM | POA: Diagnosis present

## 2015-06-02 DIAGNOSIS — Z87891 Personal history of nicotine dependence: Secondary | ICD-10-CM | POA: Diagnosis not present

## 2015-06-02 DIAGNOSIS — I10 Essential (primary) hypertension: Secondary | ICD-10-CM | POA: Diagnosis present

## 2015-06-02 DIAGNOSIS — E43 Unspecified severe protein-calorie malnutrition: Secondary | ICD-10-CM | POA: Insufficient documentation

## 2015-06-02 DIAGNOSIS — C778 Secondary and unspecified malignant neoplasm of lymph nodes of multiple regions: Secondary | ICD-10-CM | POA: Diagnosis not present

## 2015-06-02 DIAGNOSIS — M6284 Sarcopenia: Secondary | ICD-10-CM | POA: Diagnosis present

## 2015-06-02 DIAGNOSIS — C7982 Secondary malignant neoplasm of genital organs: Secondary | ICD-10-CM | POA: Diagnosis not present

## 2015-06-02 HISTORY — DX: Malignant neoplasm of vulva, unspecified: C51.9

## 2015-06-02 LAB — URINALYSIS, ROUTINE W REFLEX MICROSCOPIC
Glucose, UA: NEGATIVE mg/dL
Ketones, ur: 40 mg/dL — AB
NITRITE: NEGATIVE
Protein, ur: 100 mg/dL — AB
SPECIFIC GRAVITY, URINE: 1.017 (ref 1.005–1.030)
UROBILINOGEN UA: 0.2 mg/dL (ref 0.0–1.0)
pH: 6 (ref 5.0–8.0)

## 2015-06-02 LAB — COMPREHENSIVE METABOLIC PANEL
ALT: 7 U/L — AB (ref 14–54)
AST: 12 U/L — ABNORMAL LOW (ref 15–41)
Albumin: 2.6 g/dL — ABNORMAL LOW (ref 3.5–5.0)
Alkaline Phosphatase: 43 U/L (ref 38–126)
Anion gap: 14 (ref 5–15)
BUN: 11 mg/dL (ref 6–20)
CHLORIDE: 95 mmol/L — AB (ref 101–111)
CO2: 26 mmol/L (ref 22–32)
CREATININE: 0.84 mg/dL (ref 0.44–1.00)
Calcium: 9.8 mg/dL (ref 8.9–10.3)
GFR calc non Af Amer: >60 mL/min (ref >60)
Glucose, Bld: 79 mg/dL (ref 65–99)
POTASSIUM: 3.4 mmol/L — AB (ref 3.5–5.1)
SODIUM: 135 mmol/L (ref 135–145)
Total Bilirubin: 0.8 mg/dL (ref 0.3–1.2)
Total Protein: 5.6 g/dL — ABNORMAL LOW (ref 6.5–8.1)

## 2015-06-02 LAB — I-STAT CG4 LACTIC ACID, ED
LACTIC ACID, VENOUS: 1.57 mmol/L (ref 0.5–2.0)
LACTIC ACID, VENOUS: 1.12 mmol/L (ref 0.5–2.0)

## 2015-06-02 LAB — CBC WITH DIFFERENTIAL/PLATELET
Basophils Absolute: 0 10*3/uL (ref 0.0–0.1)
Basophils Relative: 0 %
EOS ABS: 0.6 10*3/uL (ref 0.0–0.7)
EOS PCT: 3 %
HCT: 32.3 % — ABNORMAL LOW (ref 36.0–46.0)
HEMOGLOBIN: 10.5 g/dL — AB (ref 12.0–15.0)
LYMPHS ABS: 0.6 10*3/uL — AB (ref 0.7–4.0)
Lymphocytes Relative: 4 %
MCH: 31.1 pg (ref 26.0–34.0)
MCHC: 32.5 g/dL (ref 30.0–36.0)
MCV: 95.6 fL (ref 78.0–100.0)
MONOS PCT: 5 %
Monocytes Absolute: 0.8 10*3/uL (ref 0.1–1.0)
Neutro Abs: 14.5 10*3/uL — ABNORMAL HIGH (ref 1.7–7.7)
Neutrophils Relative %: 88 %
PLATELETS: 149 10*3/uL — AB (ref 150–400)
RBC: 3.38 MIL/uL — ABNORMAL LOW (ref 3.87–5.11)
RDW: 17.9 % — ABNORMAL HIGH (ref 11.5–15.5)
WBC: 16.4 10*3/uL — ABNORMAL HIGH (ref 4.0–10.5)

## 2015-06-02 LAB — URINE MICROSCOPIC-ADD ON

## 2015-06-02 MED ORDER — VANCOMYCIN HCL IN DEXTROSE 1-5 GM/200ML-% IV SOLN
1000.0000 mg | Freq: Once | INTRAVENOUS | Status: AC
  Administered 2015-06-02: 1000 mg via INTRAVENOUS
  Filled 2015-06-02: qty 200

## 2015-06-02 MED ORDER — SODIUM CHLORIDE 0.9 % IV BOLUS (SEPSIS)
1000.0000 mL | Freq: Once | INTRAVENOUS | Status: AC
  Administered 2015-06-02: 1000 mL via INTRAVENOUS

## 2015-06-02 MED ORDER — SODIUM CHLORIDE 0.9 % IV BOLUS (SEPSIS)
2000.0000 mL | Freq: Once | INTRAVENOUS | Status: AC
  Administered 2015-06-02: 2000 mL via INTRAVENOUS

## 2015-06-02 MED ORDER — SODIUM CHLORIDE 0.9 % IV SOLN
INTRAVENOUS | Status: AC
  Administered 2015-06-02: 23:00:00 via INTRAVENOUS
  Administered 2015-06-04: 1 mL via INTRAVENOUS

## 2015-06-02 MED ORDER — PIPERACILLIN-TAZOBACTAM 3.375 G IVPB
3.3750 g | Freq: Once | INTRAVENOUS | Status: AC
  Administered 2015-06-02: 3.375 g via INTRAVENOUS
  Filled 2015-06-02: qty 50

## 2015-06-02 NOTE — Telephone Encounter (Signed)
Called and left a message for [facility] regarding her appointment today with Dr. Sondra Come. Requested a return call.

## 2015-06-02 NOTE — ED Provider Notes (Signed)
CSN: [id number]     Arrival date & time 06/02/15  1715 History   First MD Initiated Contact with Patient 06/02/15 1734     Chief Complaint  Patient presents with  . Hypotension  . Fatigue     (Consider location/radiation/quality/duration/timing/severity/associated sxs/prior Treatment) Patient is a 69 y.o. female presenting with weakness. The history is provided by the patient (The patient states she was over at the cancer center today and her blood pressure was found to be low. She has a history of fall while cancer and they were trying to arrange radiation for her today).  Weakness This is a new problem. The current episode started 12 to 24 hours ago. The problem occurs constantly. The problem has not changed since onset.Pertinent negatives include no chest pain, no abdominal pain and no headaches. Nothing aggravates the symptoms. Nothing relieves the symptoms.    Past Medical History  Diagnosis Date  . Hypertension   . Vulvar cancer    Past Surgical History  Procedure Laterality Date  . Tubal ligation     Family History  Problem Relation Age of Onset  . Uterine cancer Mother   . Cancer Brother    Social History  Substance Use Topics  . Smoking status: Former Smoker -- 1.00 packs/day for 15 years    Types: Cigarettes    Quit date: 02/16/2015  . Smokeless tobacco: None  . Alcohol Use: No   OB History    No data available     Review of Systems  Constitutional: Negative for appetite change and fatigue.  HENT: Negative for congestion, ear discharge and sinus pressure.   Eyes: Negative for discharge.  Respiratory: Negative for cough.   Cardiovascular: Negative for chest pain.  Gastrointestinal: Negative for abdominal pain and diarrhea.  Genitourinary: Negative for frequency and hematuria.  Musculoskeletal: Negative for back pain.  Skin: Negative for rash.  Neurological: Positive for weakness. Negative for seizures and headaches.  Psychiatric/Behavioral: Negative for  hallucinations.      Allergies  Review of patient's allergies indicates no known allergies.  Home Medications   Prior to Admission medications   Medication Sig Start Date End Date Taking? Authorizing Provider  mirtazapine (REMERON) 15 MG tablet Take 1 tablet (15 mg total) by mouth at bedtime. 05/28/15  Yes Everitt Amber, MD  feeding supplement (BOOST / RESOURCE BREEZE) LIQD Take 1 Container by mouth 2 (two) times daily between meals. Patient not taking: Reported on 06/02/2015 05/22/15   Dellia Nims, MD  metroNIDAZOLE (FLAGYL) 500 MG tablet Take 1 tablet (500 mg total) by mouth every 6 (six) hours. Patient not taking: Reported on 05/31/2015 05/22/15   Dellia Nims, MD  ondansetron (ZOFRAN) 4 MG tablet Take 1 tablet (4 mg total) by mouth every 6 (six) hours as needed for nausea. Patient not taking: Reported on 06/02/2015 05/22/15   Dellia Nims, MD  traMADol (ULTRAM) 50 MG tablet Take 1 tablet (50 mg total) by mouth every 6 (six) hours as needed for severe pain. 05/28/15   Melissa D Cross, NP   BP 84/60 mmHg  Pulse 77  Temp(Src) 97.6 F (36.4 C) (Oral)  Resp 17  SpO2 94% Physical Exam  Constitutional: She is oriented to person, place, and time. She appears well-developed.  HENT:  Head: Normocephalic.  Eyes: Conjunctivae and EOM are normal. No scleral icterus.  Neck: Neck supple. No thyromegaly present.  Cardiovascular: Normal rate and regular rhythm.  Exam reveals no gallop and no friction rub.   No murmur heard. Pulmonary/Chest:  No stridor. She has no wheezes. She has no rales. She exhibits no tenderness.  Abdominal: She exhibits no distension. There is no tenderness. There is no rebound.  Musculoskeletal: Normal range of motion. She exhibits no edema.  Lymphadenopathy:    She has no cervical adenopathy.  Neurological: She is oriented to person, place, and time. She exhibits normal muscle tone. Coordination normal.  Skin: No rash noted. No erythema.  Psychiatric: She has a normal mood  and affect. Her behavior is normal.    ED Course  Procedures (including critical care time) Labs Review Labs Reviewed  COMPREHENSIVE METABOLIC PANEL - Abnormal; Notable for the following:    Potassium 3.4 (*)    Chloride 95 (*)    Total Protein 5.6 (*)    Albumin 2.6 (*)    AST 12 (*)    ALT 7 (*)    All other components within normal limits  URINALYSIS, ROUTINE W REFLEX MICROSCOPIC (NOT AT [facility]) - Abnormal; Notable for the following:    Color, Urine AMBER (*)    APPearance CLOUDY (*)    Hgb urine dipstick LARGE (*)    Bilirubin Urine MODERATE (*)    Ketones, ur 40 (*)    Protein, ur 100 (*)    Leukocytes, UA LARGE (*)    All other components within normal limits  CBC WITH DIFFERENTIAL/PLATELET - Abnormal; Notable for the following:    WBC 16.4 (*)    RBC 3.38 (*)    Hemoglobin 10.5 (*)    HCT 32.3 (*)    RDW 17.9 (*)    Platelets 149 (*)    Neutro Abs 14.5 (*)    Lymphs Abs 0.6 (*)    All other components within normal limits  URINE MICROSCOPIC-ADD ON - Abnormal; Notable for the following:    Squamous Epithelial / LPF FEW (*)    Bacteria, UA FEW (*)    All other components within normal limits  CULTURE, BLOOD (ROUTINE X 2)  CULTURE, BLOOD (ROUTINE X 2)  I-STAT CG4 LACTIC ACID, ED  I-STAT CG4 LACTIC ACID, ED  I-STAT CG4 LACTIC ACID, ED    Imaging Review Dg Chest 2 View  06/02/2015   CLINICAL DATA:  Weakness and hypotension today.  EXAM: CHEST  2 VIEW  COMPARISON:  Chest radiograph 05/19/2015, PET-CT yesterday.  FINDINGS: Cardiomediastinal contours are unchanged. Small pleural effusions are seen, similar to CT performed yesterday. Multiple left-sided pulmonary nodules again demonstrated. The right-sided pulmonary nodules on PET-CT are not well seen. No pulmonary edema, consolidation, or pneumothorax. No acute osseous abnormalities are seen.  IMPRESSION: 1. Small bilateral pleural effusions. 2. Multiple pulmonary nodules consistent with metastatic disease.    Electronically Signed   By: Jeb Levering M.D.   On: 06/02/2015 18:48   Nm Pet Image Initial (pi) Skull Base To Thigh  06/01/2015   CLINICAL DATA:  Initial treatment strategy for vulvar squamous cell carcinoma.  EXAM: NUCLEAR MEDICINE PET SKULL BASE TO THIGH  TECHNIQUE: 6.4 mCi F-18 FDG was injected intravenously. Full-ring PET imaging was performed from the skull base to thigh after the radiotracer. CT data was obtained and used for attenuation correction and anatomic localization.  FASTING BLOOD GLUCOSE:  Value: 81 mg/dl  COMPARISON:  AP CT on 05/18/2015 and chest CT on 04/20/2015  FINDINGS: NECK  No hypermetabolic lymph nodes in the neck.  CHEST  Hypermetabolic lymphadenopathy is seen in the mediastinum and bilateral hilar regions. Index mediastinal lymph node and right paratracheal region measures 1.6 cm  on image 53/series 4 compared to 7 mm previously. This has a maximum SUV of 13.6.  Hypermetabolic pulmonary nodules are seen bilaterally which are increased in size and number compared to previous study. These are consistent with pulmonary metastases. Index nodule in the superior segment of the left lower lobe measures 16 mm on image 21 of series 8 compared to 9 mm previously, and has an SUV max of 11.5.  Small bilateral pleural effusions are again seen without associated hypermetabolic activity. Small hiatal hernia again noted.  ABDOMEN/PELVIS  No abnormal hypermetabolic activity within the liver, pancreas, adrenal glands, or spleen. No hypermetabolic lymph nodes in the abdomen.  A large lobulated soft tissue mass is seen involving the vulva which was incompletely visualized on previous CT. This measures approximately 5.5 x 9.2 cm on image 178 of series 4, and has an SUV max of 49.9. This is consistent with primary volar carcinoma.  Hypermetabolic lymphadenopathy is seen in the left external iliac chain, with largest lymph node measuring 2.2 cm on image 151 of series 4, with SUV max of 22.0.  Hypermetabolic left inguinal lymphadenopathy is also seen measuring 2.8 cm on image 160 of series 4, which has SUV max of 31.9. This lymphadenopathy shows no significant change in size compared to most recent CT on 05/18/2015.  SKELETON  No focal hypermetabolic activity to suggest skeletal metastasis.  IMPRESSION: Large hypermetabolic vulvar soft tissue mass, consistent with primary vulvar carcinoma.  Hypermetabolic pelvic lymphadenopathy in left external iliac chain and left inguinal region, consistent with metastatic disease.  Hypermetabolic bilateral pulmonary metastases, increased compared to recent chest CT. Small bilateral pleural effusions also noted.  Increased hypermetabolic mediastinal and bilateral hilar lymphadenopathy, consistent with metastatic disease.   Electronically Signed   By: Earle Gell M.D.   On: 06/01/2015 15:20   I have personally reviewed and evaluated these images and lab results as part of my medical decision-making.   EKG Interpretation None      MDM   Final diagnoses:  UTI (lower urinary tract infection)  Hypotension, unspecified hypotension type    CRITICAL CARE Performed by: ZAMMIT,JOSEPH L Total critical care time:40 Critical care time was exclusive of separately billable procedures and treating other patients. Critical care was necessary to treat or prevent imminent or life-threatening deterioration. Critical care was time spent personally by me on the following activities: development of treatment plan with patient and/or surrogate as well as nursing, discussions with consultants, evaluation of patient's response to treatment, examination of patient, obtaining history from patient or surrogate, ordering and performing treatments and interventions, ordering and review of laboratory studies, ordering and review of radiographic studies, pulse oximetry and re-evaluation of patient's condition. Patient has a urinary tract infection and may be septic. She has been  given 2 L of IV fluids blood pressure has come up to 353 systolic. Also she was given vancomycin and Zosyn. Patient is admitted to medicine.    Milton Ferguson, MD 06/02/15 2100

## 2015-06-02 NOTE — Progress Notes (Signed)
Received Report from [place], RN in [facility] ED for transfer of pt to [phone].

## 2015-06-02 NOTE — ED Notes (Signed)
Patient transported to X-ray 

## 2015-06-02 NOTE — Progress Notes (Signed)
Patient noted to have been admitted to hospital form 09/13 to 09/17 for sepsis.  Patient discharged with po antibiotics.  Patient was seen by Dr. Posey Pronto at [place] [facility] on 09/26.  Patient considers Dr. Posey Pronto to be her pcp.  Patient also being followed by Dr. Denman George of oncology.  No further EDCM needs at this time.

## 2015-06-02 NOTE — Progress Notes (Signed)
Radiation Oncology         [phone]) [phone] ________________________________  Initial Outpatient Consultation  Name: Carly Schwartz MRN: [id number]  Date: 06/02/2015  DOB: 01-25-1946  CC:No PCP Per Patient  Dorothyann Gibbs, NP   REFERRING PHYSICIAN: Joylene John D, NP  DIAGNOSIS: Stage IV vulvar carcinoma, squamous cell  HISTORY OF PRESENT ILLNESS::Carly Schwartz is a 69 y.o. female who has "presented to the ED (05/18/15) with fatigue, weakness, dizziness, difficulty urinating, diarrhea, and recent UTI. She was admitted for sepsis, questionable recurrent UTI, colitis on CT with WBC 23.2. During this admission, she was also noted to have a fungating vaginal mass when an I&O cath attempt was unsuccessful due to obstruction from the mass" (Dr.Rossi).  Biopsy of this mass revealed squamous cell carcinoma. The patient is now seen in radiation oncology for consideration for treatment. Earlier this week she completed a PET scan which unfortunately shows significant metastatic disease.  Today, patient presents with very low blood pressure and became extremely dizzy when standing up. She was lying down during encounter. Denies chills or fevers at home. No current issues with urination. She continues to have bleeding from the vulvar mass and requires 5-6 pads per day in light of her bleeding.  PREVIOUS RADIATION THERAPY: No  PAST MEDICAL HISTORY:  has a past medical history of Hypertension and Vulvar cancer.    PAST SURGICAL HISTORY: Past Surgical History  Procedure Laterality Date  . Tubal ligation      FAMILY HISTORY: family history includes Cancer in her brother; Uterine cancer in her mother.  SOCIAL HISTORY:  reports that she quit smoking about 3 months ago. Her smoking use included Cigarettes. She has a 15 pack-year smoking history. She does not have any smokeless tobacco history on file. She reports that she does not drink alcohol or use illicit drugs.  ALLERGIES: Review of patient's  allergies indicates no known allergies.  MEDICATIONS:  Current Outpatient Prescriptions  Medication Sig Dispense Refill  . mirtazapine (REMERON) 15 MG tablet Take 1 tablet (15 mg total) by mouth at bedtime. 30 tablet 2  . ciprofloxacin (CIPRO) 500 MG tablet Take 500 mg by mouth 2 (two) times daily.    . feeding supplement (BOOST / RESOURCE BREEZE) LIQD Take 1 Container by mouth 2 (two) times daily between meals. (Patient not taking: Reported on 06/02/2015) 60 Container 0  . metroNIDAZOLE (FLAGYL) 500 MG tablet Take 1 tablet (500 mg total) by mouth every 6 (six) hours. (Patient not taking: Reported on 05/31/2015) 22 tablet 0  . ondansetron (ZOFRAN) 4 MG tablet Take 1 tablet (4 mg total) by mouth every 6 (six) hours as needed for nausea. (Patient not taking: Reported on 06/02/2015) 20 tablet 0  . traMADol (ULTRAM) 50 MG tablet Take 1 tablet (50 mg total) by mouth every 6 (six) hours as needed for severe pain. (Patient not taking: Reported on 06/02/2015) 30 tablet 0   No current facility-administered medications for this encounter.    REVIEW OF SYSTEMS:  A 15 point review of systems is documented in the electronic medical record. This was obtained by the nursing staff. However, I reviewed this with the patient to discuss relevant findings and make appropriate changes.  Pertinent items are noted in HPI. poor appetite, food tastes poorly   PHYSICAL EXAM:  height is 5\' 2"  (1.575 m) and weight is 93 lb 4.8 oz (42.321 kg). Her axillary temperature is 97.6 F (36.4 C). Her blood pressure is 79/54 and her pulse is 125.  Her respiration is 20 and oxygen saturation is 94%.   Patient is lying down throughout entirety of exam.  General: Emaciated, in no acute distress HEENT: Normocephalic, atraumatic; ?yeast infection on dorsum of tongue Neck: Supple without any lymphadenopathy Cardiovascular: Heart regular rhythm with increased rate Respiratory: Clear to auscultation bilaterally GI: Soft, nontender, normal  bowel sounds Extremities: No edema present Neuro: No focal deficits Patient too weak to stand for orthostatic blood pressure Vaginal: Large mass measuring 12 x 7 cm replacing the vulvar region with satellite lesion on left labia measuring about 3 cm. Tumor extends down to cover anal region. More detailed exam not possible in light of patient's pain. She also has a 4 x 4.5 cm left inguinal lymph node metastasis.   ECOG = 3  LABORATORY DATA:  Lab Results  Component Value Date   WBC 12.3* 05/22/2015   HGB 8.7* 05/22/2015   HCT 27.3* 05/22/2015   MCV 93.2 05/22/2015   PLT 191 05/22/2015   NEUTROABS 10.1* 05/22/2015   Lab Results  Component Value Date   NA 134* 05/20/2015   K 3.6 05/20/2015   CL 102 05/20/2015   CO2 24 05/20/2015   GLUCOSE 80 05/20/2015   CREATININE 0.81 05/20/2015   CALCIUM 9.2 05/20/2015      RADIOGRAPHY: Ct Abdomen Pelvis W Contrast  05/18/2015   CLINICAL DATA:  Hematuria. Patient states ongoing UTI 3 months. Feeling weaker and dizzy today.  EXAM: CT ABDOMEN AND PELVIS WITH CONTRAST  TECHNIQUE: Multidetector CT imaging of the abdomen and pelvis was performed using the standard protocol following bolus administration of intravenous contrast.  CONTRAST:  90mL OMNIPAQUE IOHEXOL 300 MG/ML SOLN, 1105mL OMNIPAQUE IOHEXOL 300 MG/ML SOLN  COMPARISON:  Chest CT 04/20/2015  FINDINGS: Atelectasis/scarring over the posterior right lower lobe. Moderate size hiatal hernia unchanged.  Abdominal images demonstrate the liver, spleen, pancreas, gallbladder and adrenal glands to be within normal. Kidneys are normal in size without hydronephrosis or nephrolithiasis. There is a sub cm right renal cortical hypodensity too small to characterize but likely a cyst. Ureters are within normal. Appendix is normal.  There is minimal calcified plaque over the abdominal aorta. Small lipoma over the second portion of the duodenum.  There is mild diverticulosis of the sigmoid colon. There is wall  thickening of the colon from the splenic flexure to the junction of the sigmoid colon in the left lower quadrant compatible with a mild acute colitis. No evidence of perforation. No adjacent free fluid. Mild mucosal enhancement of several small bowel loops in the pelvis which are nondilated. There is mild engorgement of the adjacent vasa recta as findings may be due to regional enteritis. The small bowel findings as well as the colonic findings together could be seen in inflammatory bowel disease such as Crohn's disease.  Pelvic images demonstrate the bladder, uterus, ovaries and rectum to be within normal. There is a heterogeneous abnormal left inguinal lymph node measuring 3.4 cm in diameter. There is a smaller adjacent low-density lymph node in the left inguinal region. There is a low-density rim enhancing with 2.1 cm left external iliac lymph node. This may be due to metastatic disease in this patient with bilateral pulmonary nodules and pleural effusions. Mild degenerate change of the spine and hips.  IMPRESSION: Colonic wall thickening from the splenic flexure to the junction of the descending colon to sigmoid colon likely representing an acute colitis of infectious or inflammatory nature. Mild mucosal enhancement of several small bowel loops in the pelvis with  engorgement of the Vasa recta as findings may be due to a regional enteritis although together with the colonic findings could be seen in inflammatory bowel disease due to Crohn's.  Abnormal left external iliac and inguinal adenopathy suggesting metastatic disease. Recommend clinical correlation and PET scan versus tissue diagnosis via biopsy of this left inguinal node.  Moderate size hiatal hernia unchanged.   Electronically Signed   By: Marin Olp M.D.   On: 05/18/2015 22:25   Nm Pet Image Initial (pi) Skull Base To Thigh  06/01/2015   CLINICAL DATA:  Initial treatment strategy for vulvar squamous cell carcinoma.  EXAM: NUCLEAR MEDICINE PET  SKULL BASE TO THIGH  TECHNIQUE: 6.4 mCi F-18 FDG was injected intravenously. Full-ring PET imaging was performed from the skull base to thigh after the radiotracer. CT data was obtained and used for attenuation correction and anatomic localization.  FASTING BLOOD GLUCOSE:  Value: 81 mg/dl  COMPARISON:  AP CT on 05/18/2015 and chest CT on 04/20/2015  FINDINGS: NECK  No hypermetabolic lymph nodes in the neck.  CHEST  Hypermetabolic lymphadenopathy is seen in the mediastinum and bilateral hilar regions. Index mediastinal lymph node and right paratracheal region measures 1.6 cm on image 53/series 4 compared to 7 mm previously. This has a maximum SUV of 13.6.  Hypermetabolic pulmonary nodules are seen bilaterally which are increased in size and number compared to previous study. These are consistent with pulmonary metastases. Index nodule in the superior segment of the left lower lobe measures 16 mm on image 21 of series 8 compared to 9 mm previously, and has an SUV max of 11.5.  Small bilateral pleural effusions are again seen without associated hypermetabolic activity. Small hiatal hernia again noted.  ABDOMEN/PELVIS  No abnormal hypermetabolic activity within the liver, pancreas, adrenal glands, or spleen. No hypermetabolic lymph nodes in the abdomen.  A large lobulated soft tissue mass is seen involving the vulva which was incompletely visualized on previous CT. This measures approximately 5.5 x 9.2 cm on image 178 of series 4, and has an SUV max of 49.9. This is consistent with primary volar carcinoma.  Hypermetabolic lymphadenopathy is seen in the left external iliac chain, with largest lymph node measuring 2.2 cm on image 151 of series 4, with SUV max of 22.0. Hypermetabolic left inguinal lymphadenopathy is also seen measuring 2.8 cm on image 160 of series 4, which has SUV max of 31.9. This lymphadenopathy shows no significant change in size compared to most recent CT on 05/18/2015.  SKELETON  No focal  hypermetabolic activity to suggest skeletal metastasis.  IMPRESSION: Large hypermetabolic vulvar soft tissue mass, consistent with primary vulvar carcinoma.  Hypermetabolic pelvic lymphadenopathy in left external iliac chain and left inguinal region, consistent with metastatic disease.  Hypermetabolic bilateral pulmonary metastases, increased compared to recent chest CT. Small bilateral pleural effusions also noted.  Increased hypermetabolic mediastinal and bilateral hilar lymphadenopathy, consistent with metastatic disease.   Electronically Signed   By: Earle Gell M.D.   On: 06/01/2015 15:20   [place] Misc Soft Tissue  05/19/2015   CLINICAL DATA:  Vulvar mass with LEFT inguinal adenopathy by CT  EXAM: SOFT TISSUE ULTRASOUND - MISCELLANEOUS  TECHNIQUE: Ultrasound examination of the pelvic soft tissues was performed in the area of clinical concern at the LEFT inguinal region and vulva.  COMPARISON:  CT abdomen and pelvis 05/18/2015  FINDINGS: Large heterogeneous predominately hypoechoic involve are soft tissue mass identified measuring 9.2 x 8.4 x 5.1 cm.  Mass extends towards the  vagina but full extent is poorly visualized due to sound attenuation and size of lesion.  Few small microcalcifications are seen within the mass.  Mass demonstrates significant internal vascularity on color Doppler imaging.  Small adjacent hypervascular mass identified 2.7 x 1.7 x 2.3 cm in size which could represent a additional tumor nodule or a metastatic lymph node.  Preceding CT exam showed heterogeneously enhancing abnormal LEFT inguinal and external iliac adenopathy.  Prior CT however did not demonstrate full extent of this tumor, did not extend far enough inferiorly.  IMPRESSION: Large vulvar mass highly suspicious for neoplasm, 9.2 x 8.4 x 5.1 cm in size, hypervascular and containing a few microcalcifications.  Additional subcutaneous nodule is seen which may represent a additional tumor or metastatic lymph node 2.7 x 1.7 x 2.3 cm  in size.  Patient had LEFT inguinal and external iliac adenopathy by preceding CT.  Since the origin and deep extent of this lesion are not well visualized, recommend MR imaging with and without contrast to further characterize.   Electronically Signed   By: Lavonia Dana M.D.   On: 05/19/2015 17:20   Dg Chest Port 1 View  05/19/2015   CLINICAL DATA:  Sepsis.  EXAM: PORTABLE CHEST - 1 VIEW  COMPARISON:  Chest CT 04/20/2015  FINDINGS: Left upper lobe pulmonary nodule is less well-defined than on prior exam. Additional pulmonary nodules on prior CT are not definitively seen. Heart size and mediastinal contours are unchanged. No confluent airspace disease to suggest pneumonia. No pleural effusion or pneumothorax.  IMPRESSION: 1.  No acute pulmonary process. 2. Left upper lobe pulmonary nodule, as seen on recent chest CT. Additional pulmonary nodules on CT are not seen radiographically.   Electronically Signed   By: Jeb Levering M.D.   On: 05/19/2015 05:38      IMPRESSion: stage IV vulvar carcinoma (squamous cell). Recent PET scan shows diffuse lung metastasis as well as mediastinal and hilar metastasis.  Disease has resulted in poor performance status The patient is extremely weak with low blood pressure. . I discussed with patient and her boyfriend that she would not be a candidate for curative therapy but may benefit from palliative radiation treatment directed at the lower pelvis area.   PLAN: We discussed the possible side effects and risks of treatment in addition to the possible benefits of treatment. We discussed the protocol for radiation treatment.  All of the patient's questions were answered. The patient does wish to proceed with this treatment. A simulation will be scheduled such that we can proceed with treatment planning.  I have sent the patient to the emergency department today. She is too weak to stand and is dehydrated and presented to the clinic too late for further workup or fluid  supplementation. She needs a thorough workup to rule out possible sepsis If she is admitted to the hospital tonight proceed with radiation planning tomorrow and treatment in the near future. If she is discharged after fluid supplementation and she will return as an outpatient tomorrow for planning for her palliative radiation therapy directed at the lower pelvis area.    ------------------------------------------------  Blair Promise, PhD, MD  This document serves as a record of services personally performed by Gery Pray, MD. It was created on his behalf by Derek Mound, a trained medical scribe. The creation of this record is based on the scribe's personal observations and the provider's statements to them. This document has been checked and approved by the attending provider.

## 2015-06-02 NOTE — ED Notes (Signed)
Pt was at [facility] for consult regarding ovarian cancer, when pt vitals sign were abnormal, BP for 09Q systolic and HR 330Q.  Pt c/o weakness then but states here that she has been having weakness for past couple of days and hasnt been having adequate PO intake.  Pt was recently admitted to hospital earlier this month for sepsis.

## 2015-06-03 ENCOUNTER — Telehealth: Payer: Self-pay | Admitting: Oncology

## 2015-06-03 ENCOUNTER — Encounter (HOSPITAL_COMMUNITY): Payer: Self-pay | Admitting: General Practice

## 2015-06-03 DIAGNOSIS — E43 Unspecified severe protein-calorie malnutrition: Secondary | ICD-10-CM | POA: Insufficient documentation

## 2015-06-03 LAB — BASIC METABOLIC PANEL
ANION GAP: 10 (ref 5–15)
Anion gap: 12 (ref 5–15)
BUN: 6 mg/dL (ref 6–20)
CALCIUM: 8.8 mg/dL — AB (ref 8.9–10.3)
CALCIUM: 9 mg/dL (ref 8.9–10.3)
CHLORIDE: 104 mmol/L (ref 101–111)
CO2: 23 mmol/L (ref 22–32)
CO2: 21 mmol/L — AB (ref 22–32)
CREATININE: 0.87 mg/dL (ref 0.44–1.00)
Chloride: 104 mmol/L (ref 101–111)
Creatinine, Ser: 0.88 mg/dL (ref 0.44–1.00)
GFR calc Af Amer: >60 mL/min (ref >60)
GFR calc Af Amer: >60 mL/min (ref >60)
GFR calc non Af Amer: >60 mL/min (ref >60)
GLUCOSE: 66 mg/dL (ref 65–99)
Glucose, Bld: 85 mg/dL (ref 65–99)
Potassium: 2.6 mmol/L — CL (ref 3.5–5.1)
Potassium: 3.1 mmol/L — ABNORMAL LOW (ref 3.5–5.1)
SODIUM: 137 mmol/L (ref 135–145)
Sodium: 137 mmol/L (ref 135–145)

## 2015-06-03 LAB — DIFFERENTIAL
BASOS PCT: 0 %
Basophils Absolute: 0 10*3/uL (ref 0.0–0.1)
EOS ABS: 0.9 10*3/uL — AB (ref 0.0–0.7)
EOS PCT: 7 %
LYMPHS ABS: 0.8 10*3/uL (ref 0.7–4.0)
Lymphocytes Relative: 7 %
MONOS PCT: 5 %
Monocytes Absolute: 0.6 10*3/uL (ref 0.1–1.0)
NEUTROS PCT: 81 %
Neutro Abs: 9.8 10*3/uL — ABNORMAL HIGH (ref 1.7–7.7)

## 2015-06-03 LAB — CBC
HCT: 27 % — ABNORMAL LOW (ref 36.0–46.0)
Hemoglobin: 8.6 g/dL — ABNORMAL LOW (ref 12.0–15.0)
MCH: 30.3 pg (ref 26.0–34.0)
MCHC: 31.9 g/dL (ref 30.0–36.0)
MCV: 95.1 fL (ref 78.0–100.0)
PLATELETS: 140 10*3/uL — AB (ref 150–400)
RBC: 2.84 MIL/uL — ABNORMAL LOW (ref 3.87–5.11)
RDW: 18.1 % — AB (ref 11.5–15.5)
WBC: 11.6 10*3/uL — ABNORMAL HIGH (ref 4.0–10.5)

## 2015-06-03 LAB — MAGNESIUM: Magnesium: 1.6 mg/dL — ABNORMAL LOW (ref 1.7–2.4)

## 2015-06-03 MED ORDER — VANCOMYCIN HCL IN DEXTROSE 1-5 GM/200ML-% IV SOLN: 1000.0000 mg | INTRAVENOUS | Status: DC

## 2015-06-03 MED ORDER — VANCOMYCIN HCL IN DEXTROSE 1-5 GM/200ML-% IV SOLN
1000.0000 mg | INTRAVENOUS | Status: DC
  Filled 2015-06-03: qty 200

## 2015-06-03 MED ORDER — MIRTAZAPINE 15 MG PO TABS
15.0000 mg | ORAL_TABLET | Freq: Every day | ORAL | Status: DC
  Administered 2015-06-03 – 2015-06-05 (×3): 15 mg via ORAL
  Filled 2015-06-03 (×4): qty 1

## 2015-06-03 MED ORDER — POTASSIUM CHLORIDE 20 MEQ PO PACK
40.0000 meq | PACK | Freq: Two times a day (BID) | ORAL | Status: AC
  Administered 2015-06-03: 40 meq via ORAL
  Filled 2015-06-03 (×3): qty 2

## 2015-06-03 MED ORDER — POTASSIUM CHLORIDE 10 MEQ/100ML IV SOLN
10.0000 meq | INTRAVENOUS | Status: AC
  Administered 2015-06-03 (×2): 10 meq via INTRAVENOUS
  Filled 2015-06-03 (×2): qty 100

## 2015-06-03 MED ORDER — BOOST / RESOURCE BREEZE PO LIQD
1.0000 | Freq: Two times a day (BID) | ORAL | Status: DC
  Administered 2015-06-03 – 2015-06-04 (×3): 1 via ORAL

## 2015-06-03 MED ORDER — PIPERACILLIN-TAZOBACTAM 3.375 G IVPB
3.3750 g | Freq: Three times a day (TID) | INTRAVENOUS | Status: DC
  Filled 2015-06-03 (×2): qty 50

## 2015-06-03 MED ORDER — SODIUM CHLORIDE 0.9 % IV SOLN
INTRAVENOUS | Status: DC
  Administered 2015-06-03 (×2): via INTRAVENOUS

## 2015-06-03 MED ORDER — PROMETHAZINE HCL 25 MG PO TABS: 12.5000 mg | ORAL_TABLET | Freq: Four times a day (QID) | ORAL | Status: DC | PRN

## 2015-06-03 MED ORDER — MAGNESIUM SULFATE 2 GM/50ML IV SOLN
2.0000 g | Freq: Once | INTRAVENOUS | Status: AC
  Administered 2015-06-03: 2 g via INTRAVENOUS
  Filled 2015-06-03: qty 50

## 2015-06-03 MED ORDER — PIPERACILLIN-TAZOBACTAM 3.375 G IVPB
3.3750 g | Freq: Three times a day (TID) | INTRAVENOUS | Status: DC
  Administered 2015-06-03: 3.375 g via INTRAVENOUS
  Filled 2015-06-03 (×2): qty 50

## 2015-06-03 MED ORDER — DEXTROSE 5 % IV SOLN
1.0000 g | INTRAVENOUS | Status: DC
  Administered 2015-06-03 – 2015-06-09 (×7): 1 g via INTRAVENOUS
  Filled 2015-06-03 (×7): qty 10

## 2015-06-03 MED ORDER — ENOXAPARIN SODIUM 30 MG/0.3ML ~~LOC~~ SOLN
30.0000 mg | SUBCUTANEOUS | Status: DC
  Administered 2015-06-03 – 2015-06-13 (×11): 30 mg via SUBCUTANEOUS
  Filled 2015-06-03 (×11): qty 0.3

## 2015-06-03 MED ORDER — PIPERACILLIN-TAZOBACTAM 3.375 G IVPB
3.3750 g | Freq: Three times a day (TID) | INTRAVENOUS | Status: DC
  Administered 2015-06-03: 3.375 g via INTRAVENOUS
  Filled 2015-06-03 (×3): qty 50

## 2015-06-03 MED ORDER — PROMETHAZINE HCL 25 MG PO TABS
12.5000 mg | ORAL_TABLET | Freq: Four times a day (QID) | ORAL | Status: DC
  Administered 2015-06-03 – 2015-06-06 (×9): 12.5 mg via ORAL
  Filled 2015-06-03 (×10): qty 1

## 2015-06-03 MED ORDER — POTASSIUM CHLORIDE 10 MEQ/100ML IV SOLN
10.0000 meq | INTRAVENOUS | Status: AC
  Administered 2015-06-03 (×4): 10 meq via INTRAVENOUS
  Filled 2015-06-03 (×4): qty 100

## 2015-06-03 MED ORDER — SODIUM CHLORIDE 0.9 % IV BOLUS (SEPSIS)
1000.0000 mL | Freq: Once | INTRAVENOUS | Status: AC
  Administered 2015-06-03: 1000 mL via INTRAVENOUS

## 2015-06-03 NOTE — Care Management Note (Signed)
Case Management Note  Patient Details  Name: Carly Schwartz MRN: [id number] Date of Birth: 03-06-1946  Subjective/Objective:        Date:06-03-15 Thursday Spoke with patient at the bedside. Introduced self as Tourist information centre manager and explained role in discharge planning and how to be reached. Verified patient lives in [address] [place], [place] [address] basement apartment with boyfriend [name] [phone], and patient's granddaughter.  Verified patient anticipates to go home with family, at time of discharge and will have full- time supervision by family at this time to best of their knowledge. Patient has DME walker ordered through Dr Posey Pronto to be delivered to patient's home, patient not sure if walker has been delivered yet or not.  Expressed potential need for walker if not already delivered. Patient denied  needing help with their medication. Patient is driven by boyfriend to MD appointments. Verified patient has PCP Dr Posey Pronto. Patient states they currently receive [place] services through no one.    Plan: CM will continue to follow for discharge planning and [facility] resources.   Carles Collet RN BSN CM [phone]             Action/Plan:   Expected Discharge Date:   (unknown)               Expected Discharge Plan:  Home/Self Care  In-House Referral:     Discharge planning Services  CM Consult  Post Acute Care Choice:    Choice offered to:     DME Arranged:    DME Agency:     HH Arranged:    HH Agency:     Status of Service:  In process, will continue to follow  Medicare Important Message Given:    Date Medicare IM Given:    Medicare IM give by:    Date Additional Medicare IM Given:    Additional Medicare Important Message give by:     If discussed at [place] of Stay Meetings, dates discussed:    Additional Comments:  Carles Collet, RN 06/03/2015, 3:26 PM

## 2015-06-03 NOTE — H&P (Signed)
Date: 06/03/2015               Patient Name:  Carly Schwartz MRN: [id number]  DOB: 04-30-1946 Age / Sex: 69 y.o., female   PCP: Zada Finders, MD         Medical Service: Internal Medicine Teaching Service         Attending Physician: Dr. Carlyle Basques, MD    First Contact: Dr. Jule Ser Pager: [phone]  Second Contact: Dr. Dellia Nims Pager: [phone]       After Hours (After 5p/  First Contact Pager: [phone]  weekends / holidays): Second Contact Pager: [phone]   Chief Complaint: Dizziness  History of Present Illness:   Carly Schwartz is a 69 year old woman with a PMH of recently diagnosed Vulvar SCC, malnutrition, and persistent hypotension who presents with light-headedness and dizziness. This episode was noted at the cancer center where she was receiving PET/CT scan to query lung nodules, which have been found to be metastases. She said that she has been unable to stand for the past 2 weeks due to her "legs feeling like spaghetti." She particularly feels dizzy when she gets up, but she is unable to sit for long due to pain from her vulvar cancer. She also reports a poor appetite over the last two weeks, saying that "food just doesn't taste the same." She says she'll drink water, but often times will go the entire day without eating. For anorexia and sleep disturbance, mirtazapine was started on 9/23 by her gynecological oncologist, but it has not yet been effective. She reports a 30 pound weight loss over the past four months. She reports feelings of depression, especially since her recent cancer diagnosis, but is hopeful and prays every day. She has no suicidal ideation.  She also endorses blood in her urine (3-4 weeks) that she attributes to her vulvar cancer. She denies any increase in urinary frequency or pain on urination, other than what she normally feels from the vulvar mass. She says the mass is not obstructing the flow of urine. She said that she is nauseous with dry heaving.  She tried Zofran but found it ineffective. She denies any vomiting. She endorses a dry cough without hemoptysis. She denies any chest pain or heart palpitations. She has no shortness of breath. She denies any abdominal pain or diarrhea, and said she has a bowel movement every other day. She denies any confusion or loss of consciousness. She denies any pain or swelling in her legs. She says her vulvar mass is not worsening or changing.  Carly Schwartz had been recently hospitalized for for colitis (9/13 - 9/17) and had been hospitalized for a UTI in August 2016 (polymicrobial). She was discharged on a 10-day course of ciprofloxacin and metronidazole, but could not tolerate it due to GI symptoms. Therefore, at a recent outpatient visit, these were discontinued approximately one day before completing the course since her symptoms had resolved.   In the ED at [facility], she was noted to be hypotensive to 84/60 (near baseline), and she responded to 2L NS to 102/69. She was afebrile at 98.1and mildly tachycardic at 103. Orthostats were unable to be obtained since it hurts for her to sit and she's "too weak" to stand. A UA was leukocyte positive, nitrite negative, and she was given vancomycin and zosyn in the setting of possible sepsis secondary to a UTI.   Meds: Current Facility-Administered Medications  Medication Dose Route Frequency Provider Last Rate Last Dose  .  0.9 %  sodium chloride infusion   Intravenous Continuous Milton Ferguson, MD 125 mL/hr at 06/02/15 2319    . 0.9 %  sodium chloride infusion   Intravenous Continuous Norman Herrlich, MD      . enoxaparin (LOVENOX) injection 30 mg  30 mg Subcutaneous Q24H Norman Herrlich, MD      . feeding supplement (BOOST / RESOURCE BREEZE) liquid 1 Container  1 Container Oral BID BM Norman Herrlich, MD      . mirtazapine (REMERON) tablet 15 mg  15 mg Oral QHS Norman Herrlich, MD      . piperacillin-tazobactam (ZOSYN) IVPB 3.375 g  3.375 g Intravenous 3 times per day  Erenest Blank, RPH      . potassium chloride 10 mEq in 100 mL IVPB  10 mEq Intravenous Q1 Hr x 2 Norman Herrlich, MD      . vancomycin (VANCOCIN) IVPB 1000 mg/200 mL premix  1,000 mg Intravenous Q24H Erenest Blank, RPH        Allergies: Allergies as of 06/02/2015  . (No Known Allergies)   Past Medical History  Diagnosis Date  . Hypertension   . Vulvar cancer    Past Surgical History  Procedure Laterality Date  . Tubal ligation     Family History  Problem Relation Age of Onset  . Uterine cancer Mother   . Cancer Brother    Social History   Social History  . Marital Status: Single    Spouse Name: N/A  . Number of Children: 5  . Years of Education: N/A   Occupational History  . Not on file.   Social History Main Topics  . Smoking status: Former Smoker -- 1.00 packs/day for 15 years    Types: Cigarettes    Quit date: 02/16/2015  . Smokeless tobacco: Not on file  . Alcohol Use: No  . Drug Use: No  . Sexual Activity: No   Other Topics Concern  . Not on file   Social History Narrative    Review of Systems: Negative except per HPI  Physical Exam: Blood pressure 112/60, pulse 104, temperature 98.2 F (36.8 C), temperature source Oral, resp. rate 20, height 5\' 2"  (1.575 m), weight 106 lb 7.7 oz (48.3 kg), SpO2 98 %. General: Chronically ill appearing woman lying in bed in no acute distress HEENT: Moist mucous membranes. No tonsillar exudates or erythema. PERRL, EOMI. No cervical lymphadenopathy. Cardiovascular: Regular rate and rhythm. S1 and S2 normal.  Lungs: Clear to auscultation bilaterally. No wheezes or crackles. Abdomen: No suprapubic tenderness. Normal bowel sound. No tenderness or masses. Musculoskeletal: General atrophy. 4+/5 weakness in all extremities Neuro: AAOx4 Extremities:  No clubbing, cyanosis, or edema   Lab results: Basic Metabolic Panel:  Recent Labs  06/02/15 1747  NA 135  K 3.4*  CL 95*  CO2 26  GLUCOSE 79  BUN 11    CREATININE 0.84  CALCIUM 9.8   Liver Function Tests:  Recent Labs  06/02/15 1747  AST 12*  ALT 7*  ALKPHOS 43  BILITOT 0.8  PROT 5.6*  ALBUMIN 2.6*   CBC:  Recent Labs  06/02/15 1747  WBC 16.4*  NEUTROABS 14.5*  HGB 10.5*  HCT 32.3*  MCV 95.6  PLT 149*    Recent Labs  06/01/15 1253  GLUCAP 81   Urinalysis:  Recent Labs  06/02/15 1856  COLORURINE AMBER*  LABSPEC 1.017  PHURINE 6.0  GLUCOSEU NEGATIVE  HGBUR LARGE*  BILIRUBINUR MODERATE*  KETONESUR 40*  PROTEINUR 100*  UROBILINOGEN 0.2  NITRITE NEGATIVE  LEUKOCYTESUR LARGE*     Imaging results:  Dg Chest 2 View  06/02/2015   CLINICAL DATA:  Weakness and hypotension today.  EXAM: CHEST  2 VIEW  COMPARISON:  Chest radiograph 05/19/2015, PET-CT yesterday.  FINDINGS: Cardiomediastinal contours are unchanged. Small pleural effusions are seen, similar to CT performed yesterday. Multiple left-sided pulmonary nodules again demonstrated. The right-sided pulmonary nodules on PET-CT are not well seen. No pulmonary edema, consolidation, or pneumothorax. No acute osseous abnormalities are seen.  IMPRESSION: 1. Small bilateral pleural effusions. 2. Multiple pulmonary nodules consistent with metastatic disease.   Electronically Signed   By: Jeb Levering M.D.   On: 06/02/2015 18:48   Nm Pet Image Initial (pi) Skull Base To Thigh  06/01/2015   CLINICAL DATA:  Initial treatment strategy for vulvar squamous cell carcinoma.  EXAM: NUCLEAR MEDICINE PET SKULL BASE TO THIGH  TECHNIQUE: 6.4 mCi F-18 FDG was injected intravenously. Full-ring PET imaging was performed from the skull base to thigh after the radiotracer. CT data was obtained and used for attenuation correction and anatomic localization.  FASTING BLOOD GLUCOSE:  Value: 81 mg/dl  COMPARISON:  AP CT on 05/18/2015 and chest CT on 04/20/2015  FINDINGS: NECK  No hypermetabolic lymph nodes in the neck.  CHEST  Hypermetabolic lymphadenopathy is seen in the mediastinum and  bilateral hilar regions. Index mediastinal lymph node and right paratracheal region measures 1.6 cm on image 53/series 4 compared to 7 mm previously. This has a maximum SUV of 13.6.  Hypermetabolic pulmonary nodules are seen bilaterally which are increased in size and number compared to previous study. These are consistent with pulmonary metastases. Index nodule in the superior segment of the left lower lobe measures 16 mm on image 21 of series 8 compared to 9 mm previously, and has an SUV max of 11.5.  Small bilateral pleural effusions are again seen without associated hypermetabolic activity. Small hiatal hernia again noted.  ABDOMEN/PELVIS  No abnormal hypermetabolic activity within the liver, pancreas, adrenal glands, or spleen. No hypermetabolic lymph nodes in the abdomen.  A large lobulated soft tissue mass is seen involving the vulva which was incompletely visualized on previous CT. This measures approximately 5.5 x 9.2 cm on image 178 of series 4, and has an SUV max of 49.9. This is consistent with primary volar carcinoma.  Hypermetabolic lymphadenopathy is seen in the left external iliac chain, with largest lymph node measuring 2.2 cm on image 151 of series 4, with SUV max of 22.0. Hypermetabolic left inguinal lymphadenopathy is also seen measuring 2.8 cm on image 160 of series 4, which has SUV max of 31.9. This lymphadenopathy shows no significant change in size compared to most recent CT on 05/18/2015.  SKELETON  No focal hypermetabolic activity to suggest skeletal metastasis.  IMPRESSION: Large hypermetabolic vulvar soft tissue mass, consistent with primary vulvar carcinoma.  Hypermetabolic pelvic lymphadenopathy in left external iliac chain and left inguinal region, consistent with metastatic disease.  Hypermetabolic bilateral pulmonary metastases, increased compared to recent chest CT. Small bilateral pleural effusions also noted.  Increased hypermetabolic mediastinal and bilateral hilar  lymphadenopathy, consistent with metastatic disease.   Electronically Signed   By: Earle Gell M.D.   On: 06/01/2015 15:20    EKG: Pending  Assessment & Plan by Problem:  Hypotension:  Differential includes hypovolemia due to poor oral intake, sepsis due to a urinary tract infection or colitis, or autonomic dysfunction (seen in cases of  advanced cancer - Autonomic dysfunction in patients with advanced cancer; prevalence, clinical correlates and challenges in assessment. Stone et al. 2012 - [place]). Her history of poor oral intake, presyncopal symptoms, and the fact that her hypotension is robustly fluid responsive is consistent with hypovolemia. Her lack of fever and new symptoms of UTI make sepsis less likely, although she has a leukocytosis to 16 and is immunocompromised due to malignancy. Lack of diarrhea makes colitis less likely. 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 Denman George). Plan was to refer to medical oncology for commencing cisplatin and paclitaxel if previous identified pulmonary nodules were avid, which PET/CT obtained today indicates that they are. This causes her considerable discomfort and pain, especially when she sits up. Discussions with radiation oncologist today (Dr. Sondra Come) indicated that the plan is for palliative radiation. The vulvar mass causes her  considerable pain, but she is not interested in "pain pills" at this time. - Donut cushion for sitting - Consider palliative care consult, especially for pain management and comfort  Hypokalemia: K 3.4. Came down to 2.6 during first run of potassium IV. - 2 x 10 mEq K given IV - Will give 2 additional runs potassium IV given 2.6  DVT Prophylaxis: Lovenox [place]  Dispo: Disposition is deferred at this time, awaiting improvement of current medical problems. Anticipated discharge in approximately 1-2 day(s).   The patient does have a current PCP (Zada Finders, MD) and does need an [facility] hospital follow-up appointment after discharge.  The patient does have transportation limitations that hinder transportation to clinic appointments.  Signed: Liberty Handy, MD 06/03/2015, 4:14 AM

## 2015-06-03 NOTE — Progress Notes (Signed)
Unable to complete orthostatic VS d/t weakness. Pt states "I cannot stand right now because I am very weak".

## 2015-06-03 NOTE — Progress Notes (Signed)
Pt arrived to unit alert and oriented x4. Oriented to room, unit, and staff.  Bed in lowest position and call bell is within reach. Internal Medicine paged. Will continue to monitor.

## 2015-06-03 NOTE — Progress Notes (Signed)
Subjective: Patient states she is doing fine. Not feeling dizzy currently. Has low appetite. Has some nausea.  No cp, diarrhea.  Objective: Vital signs in last 24 hours: Filed Vitals:   06/03/15 0327 06/03/15 1404 06/03/15 1406 06/03/15 1411  BP: 112/60 91/52 95/49  94/65  Pulse: 104 82 86 132  Temp: 98.2 F (36.8 C)     TempSrc: Oral     Resp: 20     Height:      Weight:      SpO2: 98%      Weight change:   Intake/Output Summary (Last 24 hours) at 06/03/15 1442 Last data filed at 06/03/15 1322  Gross per 24 hour  Intake 3242.5 ml  Output    200 ml  Net 3042.5 ml   Vitals reviewed. General: resting in bed, NAD HEENT: PERRL, EOMI, no scleral icterus Cardiac: RRR, no rubs, murmurs or gallops Pulm: clear to auscultation bilaterally, no wheezes, rales, or rhonchi Abd: soft, nontender, nondistended, BS present Ext: warm and well perfused, no pedal edema Neuro: alert and oriented X3 Did not do pelvic exam.   Lab Results: Basic Metabolic Panel:  Recent Labs Lab 06/03/15 0525 06/03/15 0914  NA 137 137  K 2.6* 3.1*  CL 104 104  CO2 23 21*  GLUCOSE 66 85  BUN 6 <5*  CREATININE 0.88 0.87  CALCIUM 8.8* 9.0  MG  --  1.6*   Liver Function Tests:  Recent Labs Lab 06/02/15 1747  AST 12*  ALT 7*  ALKPHOS 43  BILITOT 0.8  PROT 5.6*  ALBUMIN 2.6*   No results for input(s): LIPASE, AMYLASE in the last 168 hours. No results for input(s): AMMONIA in the last 168 hours. CBC:  Recent Labs Lab 06/02/15 1747 06/03/15 0525 06/03/15 0931  WBC 16.4* 11.6*  --   NEUTROABS 14.5*  --  9.8*  HGB 10.5* 8.6*  --   HCT 32.3* 27.0*  --   MCV 95.6 95.1  --   PLT 149* 140*  --    Cardiac Enzymes: No results for input(s): CKTOTAL, CKMB, CKMBINDEX, TROPONINI in the last 168 hours. BNP: No results for input(s): PROBNP in the last 168 hours. D-Dimer: No results for input(s): DDIMER in the last 168 hours. CBG:  Recent Labs Lab 06/01/15 1253  GLUCAP 81   Hemoglobin  A1C: No results for input(s): HGBA1C in the last 168 hours. Fasting Lipid Panel: No results for input(s): CHOL, HDL, LDLCALC, TRIG, CHOLHDL, LDLDIRECT in the last 168 hours. Thyroid Function Tests: No results for input(s): TSH, T4TOTAL, FREET4, T3FREE, THYROIDAB in the last 168 hours. Coagulation: No results for input(s): LABPROT, INR in the last 168 hours. Anemia Panel: No results for input(s): VITAMINB12, FOLATE, FERRITIN, TIBC, IRON, RETICCTPCT in the last 168 hours. Urine Drug Screen: Drugs of Abuse  No results found for: LABOPIA, COCAINSCRNUR, LABBENZ, AMPHETMU, THCU, LABBARB  Alcohol Level: No results for input(s): ETH in the last 168 hours. Urinalysis:  Recent Labs Lab 06/02/15 1856  COLORURINE AMBER*  LABSPEC 1.017  PHURINE 6.0  GLUCOSEU NEGATIVE  HGBUR LARGE*  BILIRUBINUR MODERATE*  KETONESUR 40*  PROTEINUR 100*  UROBILINOGEN 0.2  NITRITE NEGATIVE  LEUKOCYTESUR LARGE*   Misc. Labs:   Micro Results: Recent Results (from the past 240 hour(s))  Culture, blood (routine x 2)     Status: None (Preliminary result)   Collection Time: 06/02/15  5:30 PM  Result Value Ref Range Status   Specimen Description BLOOD RIGHT ANTECUBITAL  Final   Special  Requests BOTTLES DRAWN AEROBIC AND ANAEROBIC 5 ML  Final   Culture   Final    NO GROWTH < 24 HOURS Performed at [facility]    Report Status PENDING  Incomplete  Culture, blood (routine x 2)     Status: None (Preliminary result)   Collection Time: 06/02/15  6:51 PM  Result Value Ref Range Status   Specimen Description BLOOD RIGHT HAND  Final   Special Requests BOTTLES DRAWN AEROBIC ONLY 8 CC  Final   Culture   Final    NO GROWTH < 24 HOURS Performed at [facility]    Report Status PENDING  Incomplete   Studies/Results: Dg Chest 2 View  06/02/2015   CLINICAL DATA:  Weakness and hypotension today.  EXAM: CHEST  2 VIEW  COMPARISON:  Chest radiograph 05/19/2015, PET-CT yesterday.  FINDINGS:  Cardiomediastinal contours are unchanged. Small pleural effusions are seen, similar to CT performed yesterday. Multiple left-sided pulmonary nodules again demonstrated. The right-sided pulmonary nodules on PET-CT are not well seen. No pulmonary edema, consolidation, or pneumothorax. No acute osseous abnormalities are seen.  IMPRESSION: 1. Small bilateral pleural effusions. 2. Multiple pulmonary nodules consistent with metastatic disease.   Electronically Signed   By: Jeb Levering M.D.   On: 06/02/2015 18:48   Medications: I have reviewed the patient's current medications. Scheduled Meds: . cefTRIAXone (ROCEPHIN)  IV  1 g Intravenous Q24H  . enoxaparin (LOVENOX) injection  30 mg Subcutaneous Q24H  . feeding supplement  1 Container Oral BID BM  . magnesium sulfate 1 - 4 g bolus IVPB  2 g Intravenous Once  . mirtazapine  15 mg Oral QHS  . potassium chloride  40 mEq Oral BID  . promethazine  12.5 mg Oral Q6H   Continuous Infusions: . sodium chloride 125 mL/hr at 06/02/15 2319  . sodium chloride 125 mL/hr at 06/03/15 0429   PRN Meds:. Assessment/Plan: Active Problems:   UTI (lower urinary tract infection)   Hypokalemia   Sepsis   Vulvar cancer, carcinoma  UTI - UA did show large leuk, large hemoglobin, few bacteria. ucx was not collected - patient got vanc and zosyn because there was concern for sepsis. However patient is afebrile and her BP is low at baseline, last admission her average BP was 80-90's desptie fluid. - will treat with ceftriaxone for possible UTI.   Dizziness - likely 2/2 to orthostatic hypotension plus deconditiining from poor PO intake. - cont fluid - repeat orthostatic vitals -encouraged PO intake. Gave phenergan to help with better PO intake and avoid nausea.   hypomag and hypo K - likely 2/2 to low po intake -replete PRN   Anorexia & malnutrition - likely 2/2 to malignancy - cont feeding suplements, continue Remeron ( has only taken few doses, not enough to  see effect)  Vulvar squamous cell carcinoma - stage IV with pulm mets -  Seen by Gynecological Oncology on 9/23 (Dr. Denman George). Plan was to refer to medical oncology for commencing cisplatin and paclitaxel. T - also has to have XRT planning done. was scheduled for the simulation with Dr. Sondra Come today but this will have to be deferred until patient is more stable   Dispo: Disposition is deferred at this time, awaiting improvement of current medical problems.  Anticipated discharge in approximately 1-2 day(s).   The patient does have a current PCP (Zada Finders, MD) and does need an [facility] hospital follow-up appointment after discharge.  The patient does have transportation limitations that hinder  transportation to clinic appointments.  .Services Needed at time of discharge: Y = Yes, Blank = No PT:   OT:   RN:   Equipment:   Other:     LOS: 1 day   Dellia Nims, MD 06/03/2015, 2:42 PM

## 2015-06-03 NOTE — Telephone Encounter (Signed)
Tilda Franco, RN on 5W to see if Eily would like to come for CT SIM today at 1 pm.  Colletta Maryland said she would check and call back.

## 2015-06-03 NOTE — Progress Notes (Signed)
ANTIBIOTIC CONSULT NOTE - INITIAL  Pharmacy Consult for Vancomycin/Zosyn  Indication: rule out sepsis  No Known Allergies  Patient Measurements: Height: 5\' 2"  (157.5 cm) Weight: 106 lb 7.7 oz (48.3 kg) IBW/kg (Calculated) : 50.1  Vital Signs: Temp: 98.2 F (36.8 C) (09/29 0327) Temp Source: Oral (09/29 0327) BP: 112/60 mmHg (09/29 0327) Pulse Rate: 104 (09/29 0327)  Labs:  Recent Labs  06/02/15 1747  WBC 16.4*  HGB 10.5*  PLT 149*  CREATININE 0.84   Estimated Creatinine Clearance: 48.2 mL/min (by C-G formula based on Cr of 0.84).   Medical History: Past Medical History  Diagnosis Date  . Hypertension   . Vulvar cancer    Assessment: Found to be hypotensive at the cancer center today, afebrile, WBC is elevated, renal function good, pt with recent hospital visit, other labs/meds reviewed.   Goal of Therapy:  Vancomycin trough level 15-20 mcg/ml  Plan:  -Vancomycin 1000 mg IV q24h -Zosyn 3.375G IV q8h to be infused over 4 hours -Trend WBC, temp, renal function  -Drug levels as indicated   Narda Bonds 06/03/2015,3:58 AM

## 2015-06-03 NOTE — Progress Notes (Signed)
Initial Nutrition Assessment  DOCUMENTATION CODES:   Severe malnutrition in context of chronic illness  INTERVENTION:   -Continue Boost Breeze po BID, each supplement provides 250 kcal and 9 grams of protein  NUTRITION DIAGNOSIS:   Malnutrition related to chronic illness as evidenced by severe depletion of muscle mass, percent weight loss.  GOAL:   Patient will meet greater than or equal to 90% of their needs  MONITOR:   PO intake, Supplement acceptance, Labs, Weight trends, Skin, I & O's  REASON FOR ASSESSMENT:   Malnutrition Screening Tool    ASSESSMENT:   Carly Schwartz is a 69 year old woman with a PMH of recently diagnosed Vulvar SCC, malnutrition, and persistent hypotension who presents with light-headedness and dizziness. This episode was noted at the cancer center where she was receiving PET/CT scan to query lung nodules, which have been found to be metastases. She said that she has been unable to stand for the past 2 weeks due to her "legs feeling like spaghetti." She particularly feels dizzy when she gets up, but she is unable to sit for long due to pain from her vulvar cancer. She also reports a poor appetite over the last two weeks, saying that "food just doesn't taste the same." She says she'll drink water, but often times will go the entire day without eating. For anorexia and sleep disturbance, mirtazapine was started on 9/23 by her gynecological oncologist, but it has not yet been effective. She reports a 30 pound weight loss over the past four months. She reports feelings of depression, especially since her recent cancer diagnosis, but is hopeful and prays every day. She has no suicidal ideation.  Pt admitted with hypotension and UTI. Per H&P, pt hx of vulvular cancer, with recent finding of lung modules with metastasis.   Pt was sleeping soundly at time of visit and did not respond to name being called at time of visit. Reviewed previous RD note dated on 05/19/15 from  prior admission on 05/18/15. Nutrition-focused physical exam completed during that admission and revealed mild to moderate fat depletion, moderate to severe muscle depletion and no edema. RD suspects no changes to exam at this time. Pt with hx of severe malnutrition, which is ongoing.   Pt prefers Boost Breeze to milky-type supplements. Noted order for Boost Breeze BID; RD will continue order. Carton of [employer] on bedside table at time of visit- pt consumed about 25%. Meal completion 75%.  Per previous RD note, UBW is 129# and pt was lost 11% of usual weight in the past 3 months.  Pt was recently prescribed Remeron for appetite stimulation; rx has been continued in the hospital.  Labs reviewed: K: 3.1, Mg: 1.6.  Diet Order:  Diet regular Room service appropriate?: Yes; Fluid consistency:: Thin  Skin:  Reviewed, no issues  Last BM:  06/02/15  Height:   Ht Readings from Last 1 Encounters:  06/03/15 5\' 2"  (1.575 m)    Weight:   Wt Readings from Last 1 Encounters:  06/03/15 106 lb 7.7 oz (48.3 kg)    Ideal Body Weight:  50 kg  BMI:  Body mass index is 19.47 kg/(m^2).  Estimated Nutritional Needs:   Kcal:  1500-1700  Protein:  70-80 grams  Fluid:  1.5-1.7 L  EDUCATION NEEDS:   No education needs identified at this time  Jenifer A. Jimmye Norman, RD, LDN, CDE Pager: [phone] After hours Pager: [phone]

## 2015-06-03 NOTE — Addendum Note (Signed)
Encounter addended by: Jacqulyn Liner, RN on: 06/03/2015  7:21 AM<BR>     Documentation filed: Charges VN

## 2015-06-03 NOTE — ED Notes (Signed)
Called [facility] regarding pt transport  States truck is about to clear and will be coming for pt

## 2015-06-03 NOTE — Progress Notes (Signed)
Spoke with Dr Juleen China and plan is to have pt go to radiation simulation tomorrow morning at 0800. Called and notified nurse at Dr Clabe Seal office.

## 2015-06-03 NOTE — Progress Notes (Signed)
Received phone call from Santiago Glad at Dr Clabe Seal office requesting if pt is able to have radiation simulation today at 1 pm. Informed Dr. Juleen China, states he will see patient and ask attending about transferring patient.

## 2015-06-03 NOTE — Telephone Encounter (Signed)
Called report to Ivar Drape, RN in the ER on 06/02/15 at 1700.  Transported patient via stretcher to ER at then gave report to Rives, Therapist, sports.

## 2015-06-03 NOTE — Progress Notes (Signed)
Notified Dr Genene Churn of orthostatic vital signs, including standing HR of 132. Pt in no distress but having BM while standing.

## 2015-06-04 ENCOUNTER — Ambulatory Visit
Admit: 2015-06-04 | Discharge: 2015-06-04 | Disposition: A | Discharge status: Home / Self Care | Payer: Medicare Other | Attending: Radiation Oncology | Admitting: Radiation Oncology

## 2015-06-04 DIAGNOSIS — C519 Malignant neoplasm of vulva, unspecified: Secondary | ICD-10-CM

## 2015-06-04 LAB — CBC WITH DIFFERENTIAL/PLATELET
Basophils Absolute: 0 10*3/uL (ref 0.0–0.1)
Basophils Relative: 0 %
EOS PCT: 8 %
Eosinophils Absolute: 0.9 10*3/uL — ABNORMAL HIGH (ref 0.0–0.7)
HEMATOCRIT: 29 % — AB (ref 36.0–46.0)
Hemoglobin: 9.2 g/dL — ABNORMAL LOW (ref 12.0–15.0)
LYMPHS ABS: 0.8 10*3/uL (ref 0.7–4.0)
LYMPHS PCT: 7 %
MCH: 30.6 pg (ref 26.0–34.0)
MCHC: 31.7 g/dL (ref 30.0–36.0)
MCV: 96.3 fL (ref 78.0–100.0)
MONO ABS: 0.5 10*3/uL (ref 0.1–1.0)
MONOS PCT: 5 %
NEUTROS ABS: 9 10*3/uL — AB (ref 1.7–7.7)
Neutrophils Relative %: 80 %
PLATELETS: 150 10*3/uL (ref 150–400)
RBC: 3.01 MIL/uL — ABNORMAL LOW (ref 3.87–5.11)
RDW: 18.9 % — AB (ref 11.5–15.5)
WBC: 11.2 10*3/uL — ABNORMAL HIGH (ref 4.0–10.5)

## 2015-06-04 LAB — BASIC METABOLIC PANEL
ANION GAP: 10 (ref 5–15)
BUN: <5 mg/dL — ABNORMAL LOW (ref 6–20)
CO2: 20 mmol/L — ABNORMAL LOW (ref 22–32)
CREATININE: 0.65 mg/dL (ref 0.44–1.00)
Calcium: 8.4 mg/dL — ABNORMAL LOW (ref 8.9–10.3)
Chloride: 109 mmol/L (ref 101–111)
GFR calc Af Amer: >60 mL/min (ref >60)
GFR calc non Af Amer: >60 mL/min (ref >60)
Glucose, Bld: 64 mg/dL — ABNORMAL LOW (ref 65–99)
Potassium: 3.2 mmol/L — ABNORMAL LOW (ref 3.5–5.1)
Sodium: 139 mmol/L (ref 135–145)

## 2015-06-04 LAB — C DIFFICILE QUICK SCREEN W PCR REFLEX
C DIFFICILE (CDIFF) TOXIN: NEGATIVE
C DIFFICLE (CDIFF) ANTIGEN: NEGATIVE
C Diff interpretation: NEGATIVE

## 2015-06-04 LAB — MAGNESIUM: Magnesium: 1.9 mg/dL (ref 1.7–2.4)

## 2015-06-04 MED ORDER — POTASSIUM CHLORIDE 10 MEQ/100ML IV SOLN
10.0000 meq | INTRAVENOUS | Status: AC
  Administered 2015-06-04 (×2): 10 meq via INTRAVENOUS
  Filled 2015-06-04 (×2): qty 100

## 2015-06-04 MED ORDER — POTASSIUM CHLORIDE CRYS ER 20 MEQ PO TBCR
40.0000 meq | EXTENDED_RELEASE_TABLET | Freq: Four times a day (QID) | ORAL | Status: DC
  Filled 2015-06-04: qty 2

## 2015-06-04 MED ORDER — SODIUM CHLORIDE 0.9 % IV SOLN
INTRAVENOUS | Status: DC
  Administered 2015-06-04: 20:00:00 via INTRAVENOUS

## 2015-06-04 NOTE — Evaluation (Signed)
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  Admitted due to dizziness, postural hypotension and question sepsis with presumed UTI.  Clinical Impression  Patient presents with decreased mobility due to deficits listed in PT problem list.  She will benefit from skilled PT in the acute setting to address issues and allow decreased burden of care.  Patient likely not able to negotiate stairs for home entry, may need SNF rehab, but could go home if in home services preferred and significant other able to provide assist in the home, and if she has ambulance transport home.    Follow Up Recommendations SNF    Equipment Recommendations  None recommended by PT    Recommendations for Other Services       Precautions / Restrictions Precautions Precautions: Fall      Mobility  Bed Mobility Overal bed mobility: Needs Assistance Bed Mobility: Rolling;Sidelying to Sit;Sit to Supine Rolling: Min assist Sidelying to sit: Mod assist   Sit to supine: Min assist   General bed mobility comments: rolled to side after toileting on bedpan using rail, assist to lift trunk upright; returned to supine quickly once done standing due to pain in sitting, assist for safety  Transfers Overall transfer level: Needs assistance Equipment used: 1 person hand held assist Transfers: Sit to/from Stand Sit to Stand: Mod assist         General transfer comment: pt incontinent of stool upon standing, assist for balance, for full upright standing, no device used.  Stood for orthostatic vitals signs, then returned to supine due to pt c/o not feeling well.  Ambulation/Gait             General Gait Details: unable due to weakness, poor tolerance to  upright  Stairs            Wheelchair Mobility    Modified Rankin (Stroke Patients Only)       Balance Overall balance assessment: Needs assistance         Standing balance support: Single extremity supported Standing balance-Leahy Scale: Poor Standing balance comment: pushing back in standing due to weakness, feeling bad in standing, mod support with right UE support                             Pertinent Vitals/Pain Pain Assessment: Faces Faces Pain Scale: Hurts whole lot Pain Location: perineum when sitting Pain Intervention(s): Limited activity within patient's tolerance;Monitored during session;Repositioned    Home Living Family/patient expects to be discharged to:: Private residence Living Arrangements: Spouse/significant other Available Help at Discharge: Family;Available 24 hours/day Type of Home: Apartment Home Access: Stairs to enter Entrance Stairs-Rails: Chemical engineer of Steps: 27 steps down to apartment Home Layout: One level Home Equipment: None      Prior Function Level of Independence: Needs assistance   Gait / Transfers Assistance Needed: spouse has been assisting in/out of bed and to walk since last admission           Hand Dominance        Extremity/Trunk Assessment               Lower Extremity Assessment: Generalized weakness         Communication  Communication: No difficulties  Cognition Arousal/Alertness: Awake/alert Behavior During Therapy: Flat affect Overall Cognitive Status: No family/caregiver present to determine baseline cognitive functioning                      General Comments      Exercises        Assessment/Plan    PT Assessment Patient needs continued PT services  PT Diagnosis Generalized weakness;Acute pain;Difficulty walking   PT Problem List Decreased strength;Decreased activity tolerance;Decreased mobility;Decreased balance;Pain;Decreased  knowledge of use of DME  PT Treatment Interventions Functional mobility training;Patient/family education;Therapeutic activities;Therapeutic exercise;Stair training;Gait training;Balance training   PT Goals (Current goals can be found in the Care Plan section) Acute Rehab PT Goals Patient Stated Goal: To get stronger PT Goal Formulation: With patient Time For Goal Achievement: 06/18/15 Potential to Achieve Goals: Fair    Frequency Min 3X/week   Barriers to discharge Inaccessible home environment      Co-evaluation               End of Session   Activity Tolerance: Patient limited by fatigue;Patient limited by pain Patient left: in bed;with call bell/phone within reach           Time: 1310-1337 PT Time Calculation (min) (ACUTE ONLY): 27 min   Charges:   PT Evaluation $Initial PT Evaluation Tier I: 1 Procedure PT Treatments $Therapeutic Activity: 8-22 mins   PT G Codes:        WYNN,CYNDI 06/14/2015, 3:53 PM  Magda Kiel, [place] 06-14-15

## 2015-06-04 NOTE — Progress Notes (Signed)
NURSING PROGRESS NOTE  Carly Schwartz [id number] Transfer Data: 06/04/2015 2:58 PM Attending Provider: Elmarie Shiley, MD JIR:CVELFY Posey Pronto, MD Code Status: Full code   Carly Schwartz is a 69 y.o. female patient transferred from 5W34 to 3W at [facility].  -No acute distress noted.  -No complaints of shortness of breath.  -No complaints of chest pain.    IV:  IV in place, occlusive dsg intact without redness, IV cath antecubital right, condition patent and no redness none.   Allergies:  Review of patient's allergies indicates no known allergies.  Past Medical History:   has a past medical history of Hypertension and Vulvar cancer.  Past Surgical History:   has past surgical history that includes Tubal ligation.  Social History:   reports that she quit smoking about 3 months ago. Her smoking use included Cigarettes. She has a 15 pack-year smoking history. She does not have any smokeless tobacco history on file. She reports that she does not drink alcohol or use illicit drugs.  Skin: Dry, intact.  Patient/Family orientated to room. Information packet given to patient/family. Admission inpatient armband information verified with patient/family to include name and date of birth and placed on patient arm. Side rails up x 2, fall assessment and education completed with patient/family. Patient/family able to verbalize understanding of risk associated with falls and verbalized understanding to call for assistance before getting out of bed. Call light within reach. Patient/family able to voice and demonstrate understanding of unit orientation instructions.    Will continue to evaluate and treat per MD orders.

## 2015-06-04 NOTE — Progress Notes (Signed)
Subjective: Patient states she is feeling fine this morning, not having any dizziness, no more diarrhea overnight.  States she has to urinate frequently because of all the IV fluids we are giving her.  Patient to be transferred to [facility] with [facility] taking over her care today with Med-Surg bed so patient can receive XRT treatment for vulvar squamous cell carcinoma.  IMTS appreciative of Dr. Paulina Fusi assistance with this.  Objective: Vital signs in last 24 hours: Filed Vitals:   06/03/15 2156 06/03/15 2157 06/03/15 2215 06/04/15 0430  BP: 106/63 108/64 114/65 121/70  Pulse: 84 86 118 84  Temp: 98.7 F (37.1 C)   97.2 F (36.2 C)  TempSrc: Oral   Oral  Resp: 18 18    Height:      Weight:      SpO2: 95% 97%  95%   Weight change:   Intake/Output Summary (Last 24 hours) at 06/04/15 1238 Last data filed at 06/04/15 0900  Gross per 24 hour  Intake    440 ml  Output   1400 ml  Net   -960 ml   Vitals reviewed. General: resting in bed, NAD HEENT: EOMI, no scleral icterus Cardiac: RRR, no rubs, murmurs or gallops Pulm: clear to auscultation bilaterally, no wheezes, rales, or rhonchi Abd: soft, nontender, nondistended, BS present Ext: warm and well perfused, no pedal edema Neuro: alert and oriented X3 Did not do pelvic exam.   Lab Results: Basic Metabolic Panel:  Recent Labs Lab 06/03/15 0914 06/04/15 0700  NA 137 139  K 3.1* 3.2*  CL 104 109  CO2 21* 20*  GLUCOSE 85 64*  BUN <5* <5*  CREATININE 0.87 0.65  CALCIUM 9.0 8.4*  MG 1.6* 1.9   Liver Function Tests:  Recent Labs Lab 06/02/15 1747  AST 12*  ALT 7*  ALKPHOS 43  BILITOT 0.8  PROT 5.6*  ALBUMIN 2.6*   No results for input(s): LIPASE, AMYLASE in the last 168 hours. No results for input(s): AMMONIA in the last 168 hours. CBC:  Recent Labs Lab 06/03/15 0525 06/03/15 0931 06/04/15 0700  WBC 11.6*  --  11.2*  NEUTROABS  --  9.8* 9.0*  HGB 8.6*  --  9.2*  HCT 27.0*  --  29.0*    MCV 95.1  --  96.3  PLT 140*  --  150   Cardiac Enzymes: No results for input(s): CKTOTAL, CKMB, CKMBINDEX, TROPONINI in the last 168 hours. BNP: No results for input(s): PROBNP in the last 168 hours. D-Dimer: No results for input(s): DDIMER in the last 168 hours. CBG:  Recent Labs Lab 06/01/15 1253  GLUCAP 81   Hemoglobin A1C: No results for input(s): HGBA1C in the last 168 hours. Fasting Lipid Panel: No results for input(s): CHOL, HDL, LDLCALC, TRIG, CHOLHDL, LDLDIRECT in the last 168 hours. Thyroid Function Tests: No results for input(s): TSH, T4TOTAL, FREET4, T3FREE, THYROIDAB in the last 168 hours. Coagulation: No results for input(s): LABPROT, INR in the last 168 hours. Anemia Panel: No results for input(s): VITAMINB12, FOLATE, FERRITIN, TIBC, IRON, RETICCTPCT in the last 168 hours. Urine Drug Screen: Drugs of Abuse  No results found for: LABOPIA, COCAINSCRNUR, LABBENZ, AMPHETMU, THCU, LABBARB  Alcohol Level: No results for input(s): ETH in the last 168 hours. Urinalysis:  Recent Labs Lab 06/02/15 1856  COLORURINE AMBER*  LABSPEC 1.017  PHURINE 6.0  GLUCOSEU NEGATIVE  HGBUR LARGE*  BILIRUBINUR MODERATE*  KETONESUR 40*  PROTEINUR 100*  UROBILINOGEN 0.2  NITRITE NEGATIVE  LEUKOCYTESUR LARGE*     Micro Results: Recent Results (from the past 240 hour(s))  Culture, blood (routine x 2)     Status: None (Preliminary result)   Collection Time: 06/02/15  5:30 PM  Result Value Ref Range Status   Specimen Description BLOOD RIGHT ANTECUBITAL  Final   Special Requests BOTTLES DRAWN AEROBIC AND ANAEROBIC 5 ML  Final   Culture   Final    NO GROWTH < 24 HOURS Performed at [facility]    Report Status PENDING  Incomplete  Culture, blood (routine x 2)     Status: None (Preliminary result)   Collection Time: 06/02/15  6:51 PM  Result Value Ref Range Status   Specimen Description BLOOD RIGHT HAND  Final   Special Requests BOTTLES DRAWN AEROBIC ONLY 8 CC   Final   Culture   Final    NO GROWTH < 24 HOURS Performed at [facility]    Report Status PENDING  Incomplete   Studies/Results: Dg Chest 2 View  06/02/2015   CLINICAL DATA:  Weakness and hypotension today.  EXAM: CHEST  2 VIEW  COMPARISON:  Chest radiograph 05/19/2015, PET-CT yesterday.  FINDINGS: Cardiomediastinal contours are unchanged. Small pleural effusions are seen, similar to CT performed yesterday. Multiple left-sided pulmonary nodules again demonstrated. The right-sided pulmonary nodules on PET-CT are not well seen. No pulmonary edema, consolidation, or pneumothorax. No acute osseous abnormalities are seen.  IMPRESSION: 1. Small bilateral pleural effusions. 2. Multiple pulmonary nodules consistent with metastatic disease.   Electronically Signed   By: Jeb Levering M.D.   On: 06/02/2015 18:48   Medications: I have reviewed the patient's current medications. Scheduled Meds: . cefTRIAXone (ROCEPHIN)  IV  1 g Intravenous Q24H  . enoxaparin (LOVENOX) injection  30 mg Subcutaneous Q24H  . feeding supplement  1 Container Oral BID BM  . mirtazapine  15 mg Oral QHS  . promethazine  12.5 mg Oral Q6H   Continuous Infusions:   PRN Meds:. Assessment/Plan: Active Problems:   UTI (lower urinary tract infection)   Hypokalemia   Sepsis   Vulvar cancer, carcinoma   Protein-calorie malnutrition, severe  UTI - UA did show large leuk, large hemoglobin, few bacteria. ucx was not collected - patient got vanc and zosyn because there was concern for sepsis. However patient is afebrile and her BP is low at baseline, last admission her average BP was 80-90's despite fluid. - will treat with ceftriaxone for possible UTI.  - blood cultures NGTD  Dizziness - likely 2/2 to orthostatic hypotension plus deconditiining from poor PO intake. - repeat orthostatic vitals: BP 100/60, HR 84 lying, 108/64, 86 sitting, 114/65, 118 standing - encouraged PO intake. Gave phenergan to help with better  PO intake and avoid nausea.  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 Denman George). Plan was to refer to medical oncology for commencing cisplatin and paclitaxel. T - also has to have XRT planning done. Will transfer to [facility] in order to help facilitate this.   Dispo: Transfer to [facility] for facilitation of XRT for vulvar squamous cell carcinoma, appreciate Dr Paulina Fusi assistance.  The patient does have a current PCP (Vishal  Posey Pronto, MD) and does need an [facility] hospital follow-up appointment after discharge.  The patient does have transportation limitations that hinder transportation to clinic appointments.  .Services Needed at time of discharge: Y = Yes, Blank = No PT:   OT:   RN:   Equipment:   Other:     LOS: 2 days   Jule Ser, DO 06/04/2015, 12:38 PM

## 2015-06-04 NOTE — Progress Notes (Signed)
Patient transferred in stable condition via [facility] to [facility] for radiation simulation. Per Dr. Juleen China, patient may stay at [facility] to prevent multiple transfers between facilities. 7T06 being held at this time, awaiting response from Dr. Juleen China regarding bed availability for transfer at [facility].

## 2015-06-04 NOTE — Progress Notes (Signed)
Called report to Maudie Mercury, Therapist, sports on 3W at [facility].

## 2015-06-04 NOTE — Progress Notes (Signed)
Attempted to call report to 3W at [facility]. Nurse states she will return call in 15 minutes.

## 2015-06-04 NOTE — Progress Notes (Signed)
Patient presents with hypotension, persistent diarrhea. Last admission for colitis. C diff pending. Treating also for UTI. Patient with history of Vulvar Squamous Cell carcinoma. She will be transfer to [facility] today to start radiation tx, stimulation. Accepted to Med-surgery.  Carly Schwartz. Md.

## 2015-06-04 NOTE — Progress Notes (Signed)
  Radiation Oncology         [phone]) [phone] ________________________________  Name: Carly Schwartz MRN: [id number]  Date: 06/04/2015  DOB: 07/28/1946  SIMULATION AND TREATMENT PLANNING NOTE - INPATIENT    ICD-9-CM ICD-10-CM   1. Vulvar cancer, carcinoma 184.4 C51.9     DIAGNOSIS:  Stage IV squamous cell carcinoma of the vulva  NARRATIVE:  The patient was brought to the [place].  Identity was confirmed.  All relevant records and images related to the planned course of therapy were reviewed.  The patient freely provided informed written consent to proceed with treatment after reviewing the details related to the planned course of therapy. The consent form was witnessed and verified by the simulation staff.  Then, the patient was set-up in a stable reproducible  supine position for radiation therapy.  CT images were obtained.  Surface markings were placed.  The CT images were loaded into the planning software.  Then the target and avoidance structures were contoured.  Treatment planning then occurred.  The radiation prescription was entered and confirmed.  Then, I designed and supervised the construction of a total of 3 medically necessary complex treatment devices.  I have requested : 3D Simulation  I have requested a DVH of the following structures: GTV, PTV, bladder.  I have ordered:dose calc.  PLAN:  The patient will receive 30 Gy in 15 fractions. Consideration for a boost field will be given depending on the patient's overall performance status and response to therapy.  ________________________________  -----------------------------------  Blair Promise, PhD, MD

## 2015-06-04 NOTE — Clinical Documentation Improvement (Signed)
Internal Medicine Teaching Service  To assist with accurate code assignment, please document if the diagnosis of Sepsis has been:   - Confirmed and should be coded for this admission  - Ruled out and should not be coded for this admission  - Still suspected or likely  - Other  - Unable to clinically determine  (please document your response in the progress notes and discharge summary and not on the query form itself.)  Please exercise your independent, professional judgment when responding. A specific answer is not anticipated or expected.   Thank You, Erling Conte  RN BSN [place] [phone]

## 2015-06-04 NOTE — Progress Notes (Signed)
[facility] PROGRESS NOTE  Carly Schwartz QVZ:[id number] DOB: 03/12/1946 DOA: 06/02/2015 PCP: Carly Finders, MD  Assessment/Plan: 1. 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 -Pathology revealed squamous cell carcinoma. -A PET scan that was performed on 06/01/2015 showing primary vulvar carcinoma with metastasis to pelvic lymph nodes, lungs and hilar lymph nodes. -She was transferred to [facility] long hospital on 06/04/2015 where she underwent simulation and treatment planning  -Will plan to start radiation therapy during this hospitalization.  2.  Urinary tract infection. -Urinalysis performed on 05/25/2015 showing presence of bacteria and leukocyte esterase, she was started on empiric IV antibiotic therapy with ceftriaxone.  3.  Hypotension. -Likely the result of dehydration/fall depletion, blood pressures improved with administration of IV fluids. -Blood pressures improved. -She reported having several episodes of nausea, will continue maintenance IV fluids with normal saline running at 75 mL/hour  4.  Hypokalemia. -Patient initially present with a potassium of 2.6, improved to 3.2 after replacement. Likely secondary to profound dehydration. -Will continue potassium replacement with Kdur  5.  Hypomagnesemia -Improved with supplemementation   Code Status: Full Code Family Communication:  Disposition Plan: Plan for Radiation therapy   Consultants:  Radiation Oncology  Antibiotics:  Ceftriaxone  HPI/Subjective: Carly Schwartz is a pleasant 69 year old female with a past medical history of hypertension, admitted several weeks ago to the teaching service for urosepsis, found to have a fungating vaginal mass, undergoing biopsy of vulvar on 05/20/2015 with pathology revealing squamous cell carcinoma. Showed a prior CT scan on 04/20/2015 that showed several bilateral  pulmonary nodules, largest in the left lower lobe measuring 9 mm. Findings suggestive of metastatic disease. A PET scan was performed on 06/01/2015 that showed large hypermetabolic soft tissue mass consistent with primary vulvar carcinoma. There is also hypermetabolic pelvic lymphadenopathy in the left external iliac chain and left inguinal region consistent with metastatic disease. Additionally there was hypermetabolic bilateral pulmonary metastasis and mediastinal/bilateral hilar adenopathy consistent with metastatic disease. She was admitted to the internal medicine service at [facility] on 06/03/2015 presenting with complaints of generalized weakness, found to be hypotensive but responded to IV fluid resuscitation. She was transferred to [facility] on 06/04/2015 with plans to undergo radiation therapy.  Objective: Filed Vitals:   06/04/15 1327  BP: 149/134  Pulse: 91  Temp:   Resp:     Intake/Output Summary (Last 24 hours) at 06/04/15 1700 Last data filed at 06/04/15 1346  Gross per 24 hour  Intake    480 ml  Output   1100 ml  Net   -620 ml   Filed Weights   06/03/15 0320  Weight: 48.3 kg (106 lb 7.7 oz)    Exam:   General:  Chronically ill-appearing, cachectic, pale, in no acute distress.   Cardiovascular: Regular rate and rhythm normal S1-S2  Respiratory: Normal respiratory effort, lungs are clear to auscultation bilaterally  Abdomen: Abdomen overall soft, having mild tenderness to palpation over lower abdominal region, positive bowel sounds, 4 quadrants  Musculoskeletal: Sarcopenia present  Data Reviewed: Basic Metabolic Panel:  Recent Labs Lab 06/02/15 1747 06/03/15 0525 06/03/15 0914 06/04/15 0700  NA 135 137 137 139  K 3.4* 2.6* 3.1* 3.2*  CL 95* 104 104 109  CO2 26 23 21* 20*  GLUCOSE 79 66 85 64*  BUN 11 6 <5* <5*  CREATININE 0.84 0.88 0.87 0.65  CALCIUM 9.8 8.8* 9.0 8.4*  MG  --   --  1.6* 1.9   Liver Function Tests:  Recent  Labs Lab 06/02/15 1747  AST 12*  ALT 7*  ALKPHOS 43  BILITOT 0.8  PROT 5.6*  ALBUMIN 2.6*   No results for input(s): LIPASE, AMYLASE in the last 168 hours. No results for input(s): AMMONIA in the last 168 hours. CBC:  Recent Labs Lab 06/02/15 1747 06/03/15 0525 06/03/15 0931 06/04/15 0700  WBC 16.4* 11.6*  --  11.2*  NEUTROABS 14.5*  --  9.8* 9.0*  HGB 10.5* 8.6*  --  9.2*  HCT 32.3* 27.0*  --  29.0*  MCV 95.6 95.1  --  96.3  PLT 149* 140*  --  150   Cardiac Enzymes: No results for input(s): CKTOTAL, CKMB, CKMBINDEX, TROPONINI in the last 168 hours. BNP (last 3 results) No results for input(s): BNP in the last 8760 hours.  ProBNP (last 3 results) No results for input(s): PROBNP in the last 8760 hours.  CBG:  Recent Labs Lab 06/01/15 1253  GLUCAP 81    Recent Results (from the past 240 hour(s))  Culture, blood (routine x 2)     Status: None (Preliminary result)   Collection Time: 06/02/15  5:30 PM  Result Value Ref Range Status   Specimen Description BLOOD RIGHT ANTECUBITAL  Final   Special Requests BOTTLES DRAWN AEROBIC AND ANAEROBIC 5 ML  Final   Culture   Final    NO GROWTH 2 DAYS Performed at [facility]    Report Status PENDING  Incomplete  Culture, blood (routine x 2)     Status: None (Preliminary result)   Collection Time: 06/02/15  6:51 PM  Result Value Ref Range Status   Specimen Description BLOOD RIGHT HAND  Final   Special Requests BOTTLES DRAWN AEROBIC ONLY 8 CC  Final   Culture   Final    NO GROWTH 2 DAYS Performed at [facility]    Report Status PENDING  Incomplete  C difficile quick scan w PCR reflex     Status: None   Collection Time: 06/04/15  3:55 PM  Result Value Ref Range Status   C Diff antigen NEGATIVE NEGATIVE Final   C Diff toxin NEGATIVE NEGATIVE Final   C Diff interpretation Negative for toxigenic C. difficile  Final     Studies: Dg Chest 2 View  06/02/2015   CLINICAL DATA:  Weakness and hypotension  today.  EXAM: CHEST  2 VIEW  COMPARISON:  Chest radiograph 05/19/2015, PET-CT yesterday.  FINDINGS: Cardiomediastinal contours are unchanged. Small pleural effusions are seen, similar to CT performed yesterday. Multiple left-sided pulmonary nodules again demonstrated. The right-sided pulmonary nodules on PET-CT are not well seen. No pulmonary edema, consolidation, or pneumothorax. No acute osseous abnormalities are seen.  IMPRESSION: 1. Small bilateral pleural effusions. 2. Multiple pulmonary nodules consistent with metastatic disease.   Electronically Signed   By: Jeb Levering M.D.   On: 06/02/2015 18:48    Scheduled Meds: . cefTRIAXone (ROCEPHIN)  IV  1 g Intravenous Q24H  . enoxaparin (LOVENOX) injection  30 mg Subcutaneous Q24H  . feeding supplement  1 Container Oral BID BM  . mirtazapine  15 mg Oral QHS  . promethazine  12.5 mg Oral Q6H   Continuous Infusions:   Active Problems:   UTI (lower urinary tract infection)   Hypokalemia   Sepsis   Vulvar cancer, carcinoma   Protein-calorie malnutrition, severe    Time spent: 35 min    ZAMORA,  [place] [facility] Pager [phone]. If 7PM-7AM, please contact night-coverage at [email or web address], password [facility] 06/04/2015, 5:00 PM  LOS: 2 days

## 2015-06-05 DIAGNOSIS — D509 Iron deficiency anemia, unspecified: Secondary | ICD-10-CM | POA: Insufficient documentation

## 2015-06-05 LAB — BASIC METABOLIC PANEL
ANION GAP: 7 (ref 5–15)
CHLORIDE: 109 mmol/L (ref 101–111)
CO2: 21 mmol/L — ABNORMAL LOW (ref 22–32)
Calcium: 8.4 mg/dL — ABNORMAL LOW (ref 8.9–10.3)
Creatinine, Ser: 0.49 mg/dL (ref 0.44–1.00)
Glucose, Bld: 73 mg/dL (ref 65–99)
POTASSIUM: 3 mmol/L — AB (ref 3.5–5.1)
SODIUM: 137 mmol/L (ref 135–145)

## 2015-06-05 LAB — CBC
HEMATOCRIT: 25.6 % — AB (ref 36.0–46.0)
Hemoglobin: 8.5 g/dL — ABNORMAL LOW (ref 12.0–15.0)
MCH: 31.8 pg (ref 26.0–34.0)
MCHC: 33.2 g/dL (ref 30.0–36.0)
MCV: 95.9 fL (ref 78.0–100.0)
Platelets: 143 10*3/uL — ABNORMAL LOW (ref 150–400)
RBC: 2.67 MIL/uL — AB (ref 3.87–5.11)
RDW: 18.9 % — AB (ref 11.5–15.5)
WBC: 10.4 10*3/uL (ref 4.0–10.5)

## 2015-06-05 MED ORDER — POTASSIUM CHLORIDE 10 MEQ/100ML IV SOLN
10.0000 meq | INTRAVENOUS | Status: AC
  Administered 2015-06-05 (×4): 10 meq via INTRAVENOUS
  Filled 2015-06-05 (×4): qty 100

## 2015-06-05 MED ORDER — SODIUM CHLORIDE 0.9 % IV SOLN
INTRAVENOUS | Status: DC
  Administered 2015-06-05: 09:00:00 via INTRAVENOUS
  Filled 2015-06-05 (×3): qty 1000

## 2015-06-05 MED ORDER — MAGNESIUM SULFATE IN D5W 10-5 MG/ML-% IV SOLN
1.0000 g | Freq: Once | INTRAVENOUS | Status: AC
  Administered 2015-06-05: 1 g via INTRAVENOUS
  Filled 2015-06-05: qty 100

## 2015-06-05 MED ORDER — FERROUS SULFATE 325 (65 FE) MG PO TABS
325.0000 mg | ORAL_TABLET | Freq: Two times a day (BID) | ORAL | Status: DC
  Administered 2015-06-05 (×2): 325 mg via ORAL
  Filled 2015-06-05 (×4): qty 1

## 2015-06-05 MED ORDER — POTASSIUM CHLORIDE CRYS ER 20 MEQ PO TBCR: 40.0000 meq | EXTENDED_RELEASE_TABLET | ORAL | Status: AC

## 2015-06-05 NOTE — Progress Notes (Addendum)
[facility] PROGRESS NOTE  Carly Schwartz TGG:[id number] DOB: 1945-11-17 DOA: 06/02/2015 PCP: Carly Finders, MD Brief narrative 69 year old female with history of 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 Recently hospitalized for suspected colitis when she was found to have the vulvar mass. Since being discharged home she has ongoing nausea and poor appetite with weakness. She was referred to the ED from radiation oncology clinic where she was being evaluated for palliative radiation. He is in the ED she was found to have orthostatic hypotension with SBP in the 80s. UA was positive for UTI. Patient transferred to was to the hospital to start radiation.  Assessment/Plan:  UTI with hypotension and generalized weakness Blood pressure improving with IV fluids. Remains low normal. Continue hydration. Empiric Rocephin for UTI. Follow cultures. PT evaluation.  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 -Pathology revealed squamous cell carcinoma. -A PET scan that was performed on 06/01/2015 showing primary vulvar carcinoma with metastasis to pelvic lymph nodes, lungs and hilar lymph nodes. -She was transferred to [facility] long hospital on 06/04/2015 where she underwent simulation and treatment planning. plan to start radiation therapy during this hospitalization.  Hypokalemia Likely secondary to severe dehydration. Refusing by mouth potassium complaining of nausea. Will supplement with IV potassium  Hypomagnesemia Supplemented  Iron deficiency anemia Possibly associated with malignancy. Will supplement.  Diet: Regular DVT prophylaxis: Subcutaneous Lovenox  Code Status: Full code Family Communication: None at  bedside Disposition Plan: Home once radiation therapy initiated and clinically improved possibly on 10/3   Consultants:  Radiation onc  Procedures:  None  Antibiotics:  IV Rocephin 9/29--  HPI/Subjective: Seen and examined. Denies any pain, shortness of breath, fevers or chills.  Objective: Filed Vitals:   06/05/15 0610  BP: 95/57  Pulse: 66  Temp: 98.5 F (36.9 C)  Resp: 16    Intake/Output Summary (Last 24 hours) at 06/05/15 0823 Last data filed at 06/04/15 1346  Gross per 24 hour  Intake    360 ml  Output      0 ml  Net    360 ml   Filed Weights   06/03/15 0320  Weight: 48.3 kg (106 lb 7.7 oz)    Exam:   General:  Elderly female in no acute distress, appears fatigued  HEENT: Pallor present, moist oral mucosa, supple neck  Chest: Clear to auscultation bilaterally, no added sounds  CVS: Normal S1 and S2, no murmurs or gallop  GI: Soft, nondistended, nontender, bowel sounds present  Musculoskeletal: Warm, no edema  CNS: Alert and oriented    Data Reviewed: Basic Metabolic Panel:  Recent Labs Lab 06/02/15 1747 06/03/15 0525 06/03/15 0914 06/04/15 0700 06/05/15 0432  NA 135 137 137 139 137  K 3.4* 2.6* 3.1* 3.2* 3.0*  CL 95* 104 104 109 109  CO2 26 23 21* 20* 21*  GLUCOSE 79 66 85 64* 73  BUN 11 6 <5* <5* <5*  CREATININE 0.84 0.88 0.87 0.65 0.49  CALCIUM 9.8 8.8* 9.0 8.4* 8.4*  MG  --   --  1.6* 1.9  --    Liver Function Tests:  Recent Labs Lab 06/02/15 1747  AST 12*  ALT 7*  ALKPHOS 43  BILITOT 0.8  PROT 5.6*  ALBUMIN 2.6*   No results  for input(s): LIPASE, AMYLASE in the last 168 hours. No results for input(s): AMMONIA in the last 168 hours. CBC:  Recent Labs Lab 06/02/15 1747 06/03/15 0525 06/03/15 0931 06/04/15 0700 06/05/15 0432  WBC 16.4* 11.6*  --  11.2* 10.4  NEUTROABS 14.5*  --  9.8* 9.0*  --   HGB 10.5* 8.6*  --  9.2* 8.5*  HCT 32.3* 27.0*  --  29.0* 25.6*  MCV 95.6 95.1  --  96.3 95.9  PLT 149* 140*   --  150 143*   Cardiac Enzymes: No results for input(s): CKTOTAL, CKMB, CKMBINDEX, TROPONINI in the last 168 hours. BNP (last 3 results) No results for input(s): BNP in the last 8760 hours.  ProBNP (last 3 results) No results for input(s): PROBNP in the last 8760 hours.  CBG:  Recent Labs Lab 06/01/15 1253  GLUCAP 81    Recent Results (from the past 240 hour(s))  Culture, blood (routine x 2)     Status: None (Preliminary result)   Collection Time: 06/02/15  5:30 PM  Result Value Ref Range Status   Specimen Description BLOOD RIGHT ANTECUBITAL  Final   Special Requests BOTTLES DRAWN AEROBIC AND ANAEROBIC 5 ML  Final   Culture   Final    NO GROWTH 2 DAYS Performed at [facility]    Report Status PENDING  Incomplete  Culture, blood (routine x 2)     Status: None (Preliminary result)   Collection Time: 06/02/15  6:51 PM  Result Value Ref Range Status   Specimen Description BLOOD RIGHT HAND  Final   Special Requests BOTTLES DRAWN AEROBIC ONLY 8 CC  Final   Culture   Final    NO GROWTH 2 DAYS Performed at [facility]    Report Status PENDING  Incomplete  C difficile quick scan w PCR reflex     Status: None   Collection Time: 06/04/15  3:55 PM  Result Value Ref Range Status   C Diff antigen NEGATIVE NEGATIVE Final   C Diff toxin NEGATIVE NEGATIVE Final   C Diff interpretation Negative for toxigenic C. difficile  Final     Studies: No results found.  Scheduled Meds: . cefTRIAXone (ROCEPHIN)  IV  1 g Intravenous Q24H  . enoxaparin (LOVENOX) injection  30 mg Subcutaneous Q24H  . feeding supplement  1 Container Oral BID BM  . mirtazapine  15 mg Oral QHS  . potassium chloride  40 mEq Oral Q4H  . promethazine  12.5 mg Oral Q6H   Continuous Infusions: . sodium chloride 75 mL/hr at 06/04/15 2016     Time spent: 25 minutes    Carly Schwartz  [facility] Pager [phone] If 7PM-7AM, please contact night-coverage at [email or web address], password  [facility] 06/05/2015, 8:23 AM  LOS: 3 days

## 2015-06-06 DIAGNOSIS — I951 Orthostatic hypotension: Secondary | ICD-10-CM

## 2015-06-06 LAB — BASIC METABOLIC PANEL
Anion gap: 6 (ref 5–15)
CHLORIDE: 110 mmol/L (ref 101–111)
CO2: 18 mmol/L — AB (ref 22–32)
CREATININE: 0.55 mg/dL (ref 0.44–1.00)
Calcium: 8.6 mg/dL — ABNORMAL LOW (ref 8.9–10.3)
GFR calc Af Amer: >60 mL/min (ref >60)
GFR calc non Af Amer: >60 mL/min (ref >60)
GLUCOSE: 74 mg/dL (ref 65–99)
Potassium: 4.6 mmol/L (ref 3.5–5.1)
SODIUM: 134 mmol/L — AB (ref 135–145)

## 2015-06-06 LAB — CORTISOL: Cortisol, Plasma: 19 ug/dL

## 2015-06-06 LAB — TSH: TSH: 4.479 u[IU]/mL (ref 0.350–4.500)

## 2015-06-06 MED ORDER — SODIUM CHLORIDE 0.9 % IV SOLN
INTRAVENOUS | Status: AC
  Administered 2015-06-06: 1000 mL via INTRAVENOUS
  Administered 2015-06-07: 08:00:00 via INTRAVENOUS

## 2015-06-06 MED ORDER — SODIUM CHLORIDE 0.9 % IV BOLUS (SEPSIS)
1000.0000 mL | INTRAVENOUS | Status: DC | PRN
  Administered 2015-06-06: 1000 mL via INTRAVENOUS
  Filled 2015-06-06 (×2): qty 1000

## 2015-06-06 MED ORDER — PROMETHAZINE HCL 25 MG RE SUPP: 12.5000 mg | Freq: Four times a day (QID) | RECTAL | Status: DC | PRN

## 2015-06-06 MED ORDER — PROMETHAZINE HCL 25 MG/ML IJ SOLN
12.5000 mg | Freq: Four times a day (QID) | INTRAMUSCULAR | Status: DC | PRN
  Administered 2015-06-06 – 2015-06-08 (×3): 12.5 mg via INTRAVENOUS
  Filled 2015-06-06 (×3): qty 1

## 2015-06-06 MED ORDER — PROMETHAZINE HCL 25 MG PO TABS
12.5000 mg | ORAL_TABLET | Freq: Four times a day (QID) | ORAL | Status: DC | PRN
  Filled 2015-06-06: qty 1

## 2015-06-06 MED ORDER — SODIUM CHLORIDE 0.9 % IV BOLUS (SEPSIS)
500.0000 mL | Freq: Once | INTRAVENOUS | Status: AC
  Administered 2015-06-06: 500 mL via INTRAVENOUS

## 2015-06-06 NOTE — Clinical Social Work Note (Signed)
Clinical Social Work Assessment  Patient Details  Name: Carly Schwartz MRN: [id number] Date of Birth: 17-Jul-1946  Date of referral:  06/06/15               Reason for consult:  Facility Placement                Permission sought to share information with:  Chartered certified accountant granted to share information::  Yes, Verbal Permission Granted  Name::        Agency::     Relationship::     Contact Information:     Housing/Transportation Living arrangements for the past 2 months:  Single Family Home Source of Information:  Patient Patient Interpreter Needed:    Criminal Activity/Legal Involvement Pertinent to Current Situation/Hospitalization:    Significant Relationships:  Adult Children, Significant Other Lives with:  Significant Other (and 32 year old granddaughter) Do you feel safe going back to the place where you live?    Need for family participation in patient care:  No (Coment)  Care giving concerns:  No caregiver   Facilities manager / plan:  CSW met with pt at bedside to discuss discharge needs.  CSW provided explanation of CSW role and prompted pt to discuss her history and needs.  CSW encouraged pt to explore her thoughts and feelings related to her diagnoses and rehab needs.  CSW provided supportive listening.  CSW will send pt information to SNF's and provide pt with options.    Employment status:    Insurance information:  Medicare PT Recommendations:  [place] / Referral to community resources:     Patient/Family's Response to care:  Pt discussed living with her boyfriend and 46 year old granddaughter.  Pt stated that she has 27 steps to go outside and get to her vehicle for doctor appointments.  Pt discussed MD telling her that she might possibly need some blood.  Pt denied any history of rehab and agreed to consider the options when they come back in.  Does not want any family contacted until she speaks with  them.     Patient/Family's Understanding of and Emotional Response to Diagnosis, Current Treatment, and Prognosis:  Pt appears to understand what is going on with her health and discussed anxiety and worry regarding her health and needs.  Pt tearful as she discussed feeling as if she "needed" to go to rehab.   Emotional Assessment Appearance:  Appears stated age Attitude/Demeanor/Rapport:  Grieving, Lethargic Affect (typically observed):  Depressed Orientation:  Oriented to Self, Oriented to Place, Oriented to  Time, Oriented to Situation Alcohol / Substance use:    Psych involvement (Current and /or in the community):     Discharge Needs  Concerns to be addressed:    Readmission within the last 30 days:    Current discharge risk:    Barriers to Discharge:  No Barriers Identified   Carlean Jews, LCSW 06/06/2015, 4:26 PM

## 2015-06-06 NOTE — Progress Notes (Signed)
[facility] PROGRESS NOTE  Carly Schwartz [id number]:[id number] DOB: 10/12/1945 DOA: 06/02/2015 PCP: Zada Finders, MD    Brief narrative 69 year old female with history of 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 Recently hospitalized for suspected colitis when she was found to have the vulvar mass. Since being discharged home she has ongoing nausea and poor appetite with weakness. She was referred to the ED from radiation oncology clinic where she was being evaluated for palliative radiation. He is in the ED she was found to have orthostatic hypotension with SBP in the 80s. UA was positive for UTI. Patient transferred to was to the hospital to start radiation.  Assessment/Plan:  UTI   Empiric Rocephin . Urine cx not sent on admission. Blood cx without any growth.   hypotension and generalized weakness Patient orthostatic on admission requiring IV bolus. BP still low . Required IV bolus again last night. Check cortisol and TSH level this am.  Will give 1L NS bolus this am and continue maintenance fluid at 125cc/ hr. PT recommends SNF. Recheck h&H in am. If lower may benefit from transfusion.   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 -Pathology revealed squamous cell carcinoma. -A PET scan that was performed on 06/01/2015 showing primary vulvar carcinoma with metastasis to pelvic lymph nodes, lungs and hilar lymph nodes. -She was transferred to [facility] long hospital on 06/04/2015 where she underwent simulation and treatment planning. plan to start radiation therapy during this hospitalization.  Hypokalemia Likely secondary to severe dehydration. Supplemented.  Hypomagnesemia Supplemented  Iron deficiency  anemia Possibly associated with malignancy. Started on supplement  Protein calorie malnutrition  encouraged increased po intake. nutritionist consulted.   Diet: Regular DVT prophylaxis: Subcutaneous Lovenox  Code Status: Full code Family Communication: None at bedside Disposition Plan:  Needs clinical improvement  . Possibly next 48 hrs. PT recommends SNF   Consultants:  Radiation onc  Procedures:  None  Antibiotics:  IV Rocephin 9/29--  HPI/Subjective: Seen and examined. Reports being nauseous and having 1 episode of vomiting last evening. Low BP during the night and received 500 cc NS bolus.   Objective: Filed Vitals:   06/06/15 0736  BP: 91/61  Pulse: 101  Temp:   Resp:     Intake/Output Summary (Last 24 hours) at 06/06/15 0806 Last data filed at 06/06/15 9678  Gross per 24 hour  Intake 421.25 ml  Output    300 ml  Net 121.25 ml   Filed Weights   06/03/15 0320  Weight: 48.3 kg (106 lb 7.7 oz)    Exam:   General: appears fatigued  HEENT: Pallor present, moist oral mucosa, supple neck  Chest: Clear to auscultation bilaterally, no added sounds  CVS: Normal S1 and S2, no murmurs or gallop  GI: Soft, nondistended, nontender, bowel sounds present  Musculoskeletal: Warm, no edema  CNS: Alert and oriented    Data Reviewed: Basic Metabolic Panel:  Recent Labs Lab 06/03/15 0525 06/03/15 0914 06/04/15 0700 06/05/15 0432 06/06/15 0358  NA 137 137 139 137 134*  K 2.6* 3.1* 3.2* 3.0* 4.6  CL 104 104 109 109 110  CO2 23 21* 20* 21* 18*  GLUCOSE 66 85 64* 73 74  BUN 6 <5* <5* <5* <5*  CREATININE 0.88 0.87 0.65 0.49  0.55  CALCIUM 8.8* 9.0 8.4* 8.4* 8.6*  MG  --  1.6* 1.9  --   --    Liver Function Tests:  Recent Labs Lab 06/02/15 1747  AST 12*  ALT 7*  ALKPHOS 43  BILITOT 0.8  PROT 5.6*  ALBUMIN 2.6*   No results for input(s): LIPASE, AMYLASE in the last 168 hours. No results for input(s): AMMONIA in the last 168  hours. CBC:  Recent Labs Lab 06/02/15 1747 06/03/15 0525 06/03/15 0931 06/04/15 0700 06/05/15 0432  WBC 16.4* 11.6*  --  11.2* 10.4  NEUTROABS 14.5*  --  9.8* 9.0*  --   HGB 10.5* 8.6*  --  9.2* 8.5*  HCT 32.3* 27.0*  --  29.0* 25.6*  MCV 95.6 95.1  --  96.3 95.9  PLT 149* 140*  --  150 143*   Cardiac Enzymes: No results for input(s): CKTOTAL, CKMB, CKMBINDEX, TROPONINI in the last 168 hours. BNP (last 3 results) No results for input(s): BNP in the last 8760 hours.  ProBNP (last 3 results) No results for input(s): PROBNP in the last 8760 hours.  CBG:  Recent Labs Lab 06/01/15 1253  GLUCAP 81    Recent Results (from the past 240 hour(s))  Culture, blood (routine x 2)     Status: None (Preliminary result)   Collection Time: 06/02/15  5:30 PM  Result Value Ref Range Status   Specimen Description BLOOD RIGHT ANTECUBITAL  Final   Special Requests BOTTLES DRAWN AEROBIC AND ANAEROBIC 5 ML  Final   Culture   Final    NO GROWTH 3 DAYS Performed at [facility]    Report Status PENDING  Incomplete  Culture, blood (routine x 2)     Status: None (Preliminary result)   Collection Time: 06/02/15  6:51 PM  Result Value Ref Range Status   Specimen Description BLOOD RIGHT HAND  Final   Special Requests BOTTLES DRAWN AEROBIC ONLY 8 CC  Final   Culture   Final    NO GROWTH 3 DAYS Performed at [facility]    Report Status PENDING  Incomplete  C difficile quick scan w PCR reflex     Status: None   Collection Time: 06/04/15  3:55 PM  Result Value Ref Range Status   C Diff antigen NEGATIVE NEGATIVE Final   C Diff toxin NEGATIVE NEGATIVE Final   C Diff interpretation Negative for toxigenic C. difficile  Final     Studies: No results found.  Scheduled Meds: . cefTRIAXone (ROCEPHIN)  IV  1 g Intravenous Q24H  . enoxaparin (LOVENOX) injection  30 mg Subcutaneous Q24H  . feeding supplement  1 Container Oral BID BM  . ferrous sulfate  325 mg Oral BID WC  .  mirtazapine  15 mg Oral QHS  . promethazine  12.5 mg Oral Q6H   Continuous Infusions: . sodium chloride 0.9 % 1,000 mL with potassium chloride 40 mEq infusion 75 mL/hr at 06/05/15 0857     Time spent: 25 minutes    Carly Schwartz, [place] [facility] Pager [phone] If 7PM-7AM, please contact night-coverage at [email or web address], password [facility] 06/06/2015, 8:06 AM  LOS: 4 days

## 2015-06-06 NOTE — Progress Notes (Signed)
1L NS bolus administered per order for BP of 80/59,checked manually. BP rechecked after the bolus,90/61. Will continue to monitor.

## 2015-06-06 NOTE — Progress Notes (Signed)
BP this morning 79/58 checked in both arms, with HR 108. Pt asymptomatic. Paged on call NP and received order for 500cc NS bolus. Post bolus BP 88/57 and HR 107. Continue to monitor. Hortencia Conradi RN

## 2015-06-07 ENCOUNTER — Ambulatory Visit
Admit: 2015-06-07 | Discharge: 2015-06-07 | Disposition: A | Discharge status: Home / Self Care | Payer: Medicare Other | Attending: Radiation Oncology | Admitting: Radiation Oncology

## 2015-06-07 ENCOUNTER — Other Ambulatory Visit: Payer: Self-pay | Admitting: Gynecologic Oncology

## 2015-06-07 DIAGNOSIS — R918 Other nonspecific abnormal finding of lung field: Secondary | ICD-10-CM

## 2015-06-07 DIAGNOSIS — C519 Malignant neoplasm of vulva, unspecified: Secondary | ICD-10-CM

## 2015-06-07 LAB — CULTURE, BLOOD (ROUTINE X 2)
CULTURE: NO GROWTH
Culture: NO GROWTH

## 2015-06-07 LAB — CBC
HCT: 27.1 % — ABNORMAL LOW (ref 36.0–46.0)
Hemoglobin: 8.3 g/dL — ABNORMAL LOW (ref 12.0–15.0)
MCH: 30.1 pg (ref 26.0–34.0)
MCHC: 30.6 g/dL (ref 30.0–36.0)
MCV: 98.2 fL (ref 78.0–100.0)
PLATELETS: 155 10*3/uL (ref 150–400)
RBC: 2.76 MIL/uL — ABNORMAL LOW (ref 3.87–5.11)
RDW: 20.1 % — AB (ref 11.5–15.5)
WBC: 15 10*3/uL — AB (ref 4.0–10.5)

## 2015-06-07 MED ORDER — SODIUM CHLORIDE 0.9 % IV BOLUS (SEPSIS)
1000.0000 mL | Freq: Once | INTRAVENOUS | Status: AC
  Administered 2015-06-07: 1000 mL via INTRAVENOUS

## 2015-06-07 NOTE — Progress Notes (Signed)
PT Cancellation Note  Patient Details Name: Carly Schwartz MRN: [id number] DOB: 08-30-1946   Cancelled Treatment:    Reason Eval/Treat Not Completed: Medical issues which prohibited therapy (patient states that she just had radiation and is not feeling well. will check back in the AM for radiation schedule and attempt to see  before if possible.)   [name] 06/07/2015, 2:54 PM Tresa Endo PT [phone]

## 2015-06-07 NOTE — Progress Notes (Signed)
Nutrition Follow-up  DOCUMENTATION CODES:   Severe malnutrition in context of chronic illness  INTERVENTION:   Continue Boost Breeze po TID, each supplement provides 250 kcal and 9 grams of protein Encourage PO intake RD to continue to monitor for improvement in appetite  NUTRITION DIAGNOSIS:   Malnutrition related to chronic illness as evidenced by severe depletion of muscle mass, percent weight loss.  Ongoing.  GOAL:   Patient will meet greater than or equal to 90% of their needs  Not meeting.  MONITOR:   PO intake, Supplement acceptance, Labs, Weight trends, Skin, I & O's  REASON FOR ASSESSMENT:   Malnutrition Screening Tool    ASSESSMENT:   Carly Schwartz is a 69 year old woman with a PMH of recently diagnosed Vulvar SCC, malnutrition, and persistent hypotension who presents with light-headedness and dizziness. This episode was noted at the cancer center where she was receiving PET/CT scan to query lung nodules, which have been found to be metastases. She said that she has been unable to stand for the past 2 weeks due to her "legs feeling like spaghetti." She particularly feels dizzy when she gets up, but she is unable to sit for long due to pain from her vulvar cancer. She also reports a poor appetite over the last two weeks, saying that "food just doesn't taste the same." She says she'll drink water, but often times will go the entire day without eating. For anorexia and sleep disturbance, mirtazapine was started on 9/23 by her gynecological oncologist, but it has not yet been effective. She reports a 30 pound weight loss over the past four months. She reports feelings of depression, especially since her recent cancer diagnosis, but is hopeful and prays every day. She has no suicidal ideation.  Patient transferred from [facility] 9/30 for radiation. Pt initially assessed by RD at [facility], ordered Boost Breeze. Pt with severe malnutrition. Pt prefers clear supplements vs. Milk-based  supplements.  Since transfer patient has not been eating as well with poor appetite. She is currently refusing meals and supplements. Per Chaplain notes, pt did not want to talk much and is not wanting visitors.  RD to continue to monitor PO intake and improvements in appetite and mental status.  Labs reviewed: Low Na, BUN Mg WNL  Diet Order:  Diet regular Room service appropriate?: Yes; Fluid consistency:: Thin  Skin:  Reviewed, no issues  Last BM:  10/2  Height:   Ht Readings from Last 1 Encounters:  06/03/15 5\' 2"  (1.575 m)    Weight:   Wt Readings from Last 1 Encounters:  06/03/15 106 lb 7.7 oz (48.3 kg)    Ideal Body Weight:  50 kg  BMI:  Body mass index is 19.47 kg/(m^2).  Estimated Nutritional Needs:   Kcal:  1500-1700  Protein:  70-80 grams  Fluid:  1.5-1.7 L  EDUCATION NEEDS:   No education needs identified at this time  Clayton Bibles, MS, RD, LDN Pager: [phone] After Hours Pager: [phone]

## 2015-06-07 NOTE — Progress Notes (Signed)
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 Recently hospitalized for suspected colitis when she was found to have the vulvar mass. Since being discharged home she has ongoing nausea and poor appetite with weakness. She was referred to the ED from radiation oncology clinic where she was being evaluated for palliative radiation. He is in the ED she was found to have orthostatic hypotension with SBP in the 80s. UA was positive for UTI. Patient transferred to was to the hospital to start radiation.  Assessment/Plan:  UTI with hypotension and generalized weakness -Urine analysis on 05/25/2015 showing presence of bacteria with pyuria -She was started on ceftriaxone 1 g IV every 24 hours. -A.m. labs now show an upper trend in her white count to 15,000 from 10,400 -She remains afebrile, nonfocal on exam.  -Blood pressures remain low, hilar last blood pressure of 91/59, will bolus with 1 L of normal saline today. Follow-up on blood pressures. Meanwhile will continue IV antimicrobial therapy for now.  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 -Pathology revealed squamous cell carcinoma. -A PET scan that was performed on 06/01/2015 showing primary vulvar carcinoma with metastasis to pelvic lymph nodes, lungs and hilar lymph nodes. -She was transferred to [facility] on 06/04/2015 where she underwent simulation and treatment planning. plan to start radiation therapy on 06/07/2015.  Hypokalemia Likely secondary to severe dehydration.  -A.m. labs showing  improvement in potassium to 4.6 with potassium replacement therapy  Hypomagnesemia Supplemented  Iron deficiency anemia -Likely secondary to anemia of chronic disease/malignancy -A.m. labs showing hemoglobin of 8.3 -Repeat CBC in a.m., would consider blood transfusion if hemoglobin continues to trend down  Generalized weakness/deconditioning -Physical therapy has been consulted  Diet: Regular DVT prophylaxis: Subcutaneous Lovenox  Code Status: Full code Family Communication: None at bedside Disposition Plan: Home once radiation therapy initiated and clinically improved    Consultants:  Radiation onc  Procedures:  None  Antibiotics:  IV Rocephin 9/29--  HPI/Subjective: Patient reports feeling weak, denies nausea, vomiting, fevers, chills. Having small amount of by mouth intake today.  Objective: Filed Vitals:   06/07/15 0438  BP: 91/59  Pulse: 106  Temp: 97.7 F (36.5 C)  Resp: 16    Intake/Output Summary (Last 24 hours) at 06/07/15 1357 Last data filed at 06/07/15 0741  Gross per 24 hour  Intake 733.33 ml  Output    300 ml  Net 433.33 ml   Filed Weights   06/03/15 0320  Weight: 48.3 kg (106 lb 7.7 oz)    Exam:   General:  Elderly female in no acute distress, appears fatigued, chronically ill-appearing  HEENT: Pallor present, moist oral mucosa, supple neck  Chest: Clear to auscultation bilaterally, no added sounds  CVS: Normal S1 and S2, no murmurs or gallop  GI: Soft, nondistended, nontender, bowel sounds present  Musculoskeletal: Warm, no edema  CNS: Alert and oriented    Data Reviewed: Basic Metabolic Panel:  Recent Labs Lab 06/03/15 0525 06/03/15 0914 06/04/15 0700 06/05/15 0432 06/06/15 0358  NA 137 137 139 137 134*  K 2.6* 3.1* 3.2* 3.0* 4.6  CL 104 104 109 109 110  CO2 23 21* 20* 21* 18*  GLUCOSE 66 85 64* 73 74  BUN 6 <5* <5* <5* <5*  CREATININE 0.88 0.87 0.65 0.49 0.55  CALCIUM 8.8* 9.0 8.4* 8.4* 8.6*  MG  --  1.6*  1.9  --   --    Liver Function Tests:  Recent Labs Lab 06/02/15 1747  AST 12*  ALT 7*  ALKPHOS 43  BILITOT 0.8  PROT 5.6*  ALBUMIN 2.6*   No results for input(s): LIPASE, AMYLASE in the last 168 hours. No results for input(s): AMMONIA in the last 168 hours. CBC:  Recent Labs Lab 06/02/15 1747 06/03/15 0525 06/03/15 0931 06/04/15 0700 06/05/15 0432 06/07/15 0427  WBC 16.4* 11.6*  --  11.2* 10.4 15.0*  NEUTROABS 14.5*  --  9.8* 9.0*  --   --   HGB 10.5* 8.6*  --  9.2* 8.5* 8.3*  HCT 32.3* 27.0*  --  29.0* 25.6* 27.1*  MCV 95.6 95.1  --  96.3 95.9 98.2  PLT 149* 140*  --  150 143* 155   Cardiac Enzymes: No results for input(s): CKTOTAL, CKMB, CKMBINDEX, TROPONINI in the last 168 hours. BNP (last 3 results) No results for input(s): BNP in the last 8760 hours.  ProBNP (last 3 results) No results for input(s): PROBNP in the last 8760 hours.  CBG:  Recent Labs Lab 06/01/15 1253  GLUCAP 81    Recent Results (from the past 240 hour(s))  Culture, blood (routine x 2)     Status: None (Preliminary result)   Collection Time: 06/02/15  5:30 PM  Result Value Ref Range Status   Specimen Description BLOOD RIGHT ANTECUBITAL  Final   Special Requests BOTTLES DRAWN AEROBIC AND ANAEROBIC 5 ML  Final   Culture   Final    NO GROWTH 4 DAYS Performed at [facility]    Report Status PENDING  Incomplete  Culture, blood (routine x 2)     Status: None (Preliminary result)   Collection Time: 06/02/15  6:51 PM  Result Value Ref Range Status   Specimen Description BLOOD RIGHT HAND  Final   Special Requests BOTTLES DRAWN AEROBIC ONLY 8 CC  Final   Culture   Final    NO GROWTH 4 DAYS Performed at [facility]    Report Status PENDING  Incomplete  C difficile quick scan w PCR reflex     Status: None   Collection Time: 06/04/15  3:55 PM  Result Value Ref Range Status   C Diff antigen NEGATIVE NEGATIVE Final   C Diff toxin NEGATIVE NEGATIVE Final   C Diff  interpretation Negative for toxigenic C. difficile  Final     Studies: No results found.  Scheduled Meds: . cefTRIAXone (ROCEPHIN)  IV  1 g Intravenous Q24H  . enoxaparin (LOVENOX) injection  30 mg Subcutaneous Q24H  . feeding supplement  1 Container Oral BID BM  . ferrous sulfate  325 mg Oral BID WC  . mirtazapine  15 mg Oral QHS   Continuous Infusions:     Time spent: 35 minutes    Kelvin Cellar  [facility] Pager [phone] If 7PM-7AM, please contact night-coverage at [email or web address], password [facility] 06/07/2015, 1:57 PM  LOS: 5 days

## 2015-06-07 NOTE — Progress Notes (Signed)
   06/07/15 1000  Clinical Encounter Type  Visited With Patient  Visit Type Psychological support;Initial;Spiritual support  Referral From Physician  Consult/Referral To [place] (Comment) (Pastoral Conversation)  Stress Factors  Patient Stress Factors None identified   The Chaplain visited with the patient per referral by the physician. The patient appeared to have a flat affect upon the Chaplain's arrival. The patient was unreceptive to the Chaplain's visit and stated that she did not need to talk. She spoke in brief phrases and would not make eye contact with Chaplain Shanon Ace at that time. The Chaplain let the patient know that Spiritual Care is here to support her. The Chaplain excused herself from the room.  The medical team expressed concern for the patient and the Chaplain was given a history of the patient before entering the room. The Chaplain was informed that the patient was not eating and seemed to be in a depressive state, which the Chaplain agrees with. The patient seemed to be depressed during the brief visit.  The Chaplain will follow up with the patient and medical team at a later time.

## 2015-06-08 ENCOUNTER — Ambulatory Visit
Admit: 2015-06-08 | Discharge: 2015-06-08 | Disposition: A | Discharge status: Home / Self Care | Payer: Medicare Other | Attending: Radiation Oncology | Admitting: Radiation Oncology

## 2015-06-08 DIAGNOSIS — C519 Malignant neoplasm of vulva, unspecified: Secondary | ICD-10-CM

## 2015-06-08 LAB — CBC
HEMATOCRIT: 28.2 % — AB (ref 36.0–46.0)
HEMOGLOBIN: 9 g/dL — AB (ref 12.0–15.0)
MCH: 31.4 pg (ref 26.0–34.0)
MCHC: 31.9 g/dL (ref 30.0–36.0)
MCV: 98.3 fL (ref 78.0–100.0)
Platelets: 173 10*3/uL (ref 150–400)
RBC: 2.87 MIL/uL — ABNORMAL LOW (ref 3.87–5.11)
RDW: 20.3 % — ABNORMAL HIGH (ref 11.5–15.5)
WBC: 17.2 10*3/uL — AB (ref 4.0–10.5)

## 2015-06-08 LAB — BASIC METABOLIC PANEL
ANION GAP: 9 (ref 5–15)
BUN: <5 mg/dL — ABNORMAL LOW (ref 6–20)
CO2: 17 mmol/L — ABNORMAL LOW (ref 22–32)
Calcium: 9.3 mg/dL (ref 8.9–10.3)
Chloride: 112 mmol/L — ABNORMAL HIGH (ref 101–111)
Creatinine, Ser: 0.57 mg/dL (ref 0.44–1.00)
GFR calc Af Amer: >60 mL/min (ref >60)
GLUCOSE: 71 mg/dL (ref 65–99)
POTASSIUM: 3.6 mmol/L (ref 3.5–5.1)
SODIUM: 138 mmol/L (ref 135–145)

## 2015-06-08 NOTE — Progress Notes (Addendum)
Carly Schwartz has completed 1 fraction to her pelvis.  She denies pain today but does state it is painful to sit up due to her vaginal mass.  She is currently an inpatient for UTI, hypokalemia and sepsis.  She reports her only complaint today is nausea.  She does have phenergan that she can take PRN.  She reports she has not had any diarrhea today.  She reports having a small amount of vaginal bleeding yesterday.  She has been given the Radiation Therapy and You book to review.

## 2015-06-08 NOTE — Progress Notes (Signed)
INPATIENT Weekly Management Note Current Dose: 4 Gy  Projected Dose: 30 Gy   Narrative:  The patient presents for routine under treatment assessment.  CBCT/MVCT images/Port film x-rays were reviewed.  The chart was checked  She denies pain today, but does state it is painful to sit up due to her vaginal mass. She is currently an inpatient for UTI, hypokalemia, and sepsis. She reports her only complaint today is nausea. She does have phenergan that she can take PRN. She reports she has not had any diarrhea today. She reports having a small amount of vaginal bleeding yesterday. She was given the Radiation Therapy and You book to review.  Physical Findings:  Unchanged  Vitals: There were no vitals filed for this visit. Alert and oriented x 3. Lying down in bed. Weight:  Wt Readings from Last 3 Encounters:  06/03/15 106 lb 7.7 oz (48.3 kg)  06/02/15 93 lb 4.8 oz (42.321 kg)  05/31/15 101 lb 3.2 oz (45.904 kg)   Lab Results  Component Value Date   WBC 17.2* 06/08/2015   HGB 9.0* 06/08/2015   HCT 28.2* 06/08/2015   MCV 98.3 06/08/2015   PLT 173 06/08/2015   Lab Results  Component Value Date   CREATININE 0.57 06/08/2015   BUN <5* 06/08/2015   NA 138 06/08/2015   K 3.6 06/08/2015   CL 112* 06/08/2015   CO2 17* 06/08/2015    Impression:  The patient is tolerating radiation.  Plan:  Continue treatment as planned. Discussed possibility of worsening pain and bleeding.   This document serves as a record of services personally performed by Thea Silversmith, MD. It was created on her behalf by Darcus Austin, a trained medical scribe. The creation of this record is based on the scribe's personal observations and the provider's statements to them. This document has been checked and approved by the attending provider.

## 2015-06-08 NOTE — Progress Notes (Signed)
PT Cancellation Note  Patient Details Name: Carly Schwartz MRN: [id number] DOB: Apr 16, 1946   Cancelled Treatment:    Reason Eval/Treat Not Completed: Patient declined, states that she is not going to get up right now. RN in to encourage patient to try before her treatment. States that she will try later.  RN will notify PT if/when she is ready.   [name] 06/08/2015, 8:37 AM Tresa Endo PT [phone]

## 2015-06-08 NOTE — Progress Notes (Addendum)
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 Recently hospitalized for suspected colitis when she was found to have the vulvar mass. Since being discharged home she has ongoing nausea and poor appetite with weakness. She was referred to the ED from radiation oncology clinic where she was being evaluated for palliative radiation. He is in the ED she was found to have orthostatic hypotension with SBP in the 80s. UA was positive for UTI. Patient transferred to was to the hospital to start radiation.  Assessment/Plan:  UTI with hypotension and generalized weakness -Urine analysis on 05/25/2015 showing presence of bacteria with pyuria -She was started on ceftriaxone 1 g IV every 24 hours.- does not appear that culture on 14th grew anything -She remains afebrile, nonfocal on exam.  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 -Pathology revealed squamous cell carcinoma. -A PET scan that was performed on 06/01/2015 showing primary vulvar carcinoma with metastasis to pelvic lymph nodes, lungs and hilar lymph nodes. -She was transferred to [facility] hospital on 06/04/2015 where she underwent simulation and treatment planning.  started radiation therapy on 06/07/2015.  Hypokalemia Likely secondary to severe dehydration.  -replaced  Hypomagnesemia Supplemented  Iron deficiency anemia -Likely secondary to anemia of chronic disease/malignancy - would consider blood transfusion if hemoglobin continues to trend  down  Generalized weakness/deconditioning -Physical therapy has been consulted- SNF  Poor prognosis- ask palliative care to do [place]   Diet: Regular DVT prophylaxis: Subcutaneous Lovenox  Code Status: Full code Family Communication: None at bedside Disposition Plan: SNF   Consultants:  Radiation onc  Procedures:  None  Antibiotics:  IV Rocephin 9/29--  HPI/Subjective: Says she is not eating much  Objective: Filed Vitals:   06/08/15 0541  BP: 85/58  Temp: 98.1 F (36.7 C)  Resp: 18    Intake/Output Summary (Last 24 hours) at 06/08/15 1032 Last data filed at 06/07/15 2041  Gross per 24 hour  Intake      0 ml  Output    475 ml  Net   -475 ml   Filed Weights   06/03/15 0320  Weight: 48.3 kg (106 lb 7.7 oz)    Exam:   General:  flat, appears fatigued, chronically ill-appearing  HEENT: Pallor present, moist oral mucosa, supple neck  Chest: Clear to auscultation bilaterally, no added sounds  CVS: Normal S1 and S2, no murmurs or gallop  GI: Soft, nondistended, nontender, bowel sounds present  Musculoskeletal: Warm, no edema  Data Reviewed: Basic Metabolic Panel:  Recent Labs Lab 06/03/15 0914 06/04/15 0700 06/05/15 0432 06/06/15 0358 06/08/15 0426  NA 137 139 137 134* 138  K 3.1* 3.2* 3.0* 4.6 3.6  CL 104 109 109 110 112*  CO2 21* 20* 21* 18* 17*  GLUCOSE 85 64* 73 74 71  BUN <5* <5* <5* <5* <5*  CREATININE 0.87 0.65 0.49 0.55 0.57  CALCIUM 9.0 8.4* 8.4* 8.6* 9.3  MG 1.6* 1.9  --   --   --    Liver Function Tests:  Recent Labs Lab 06/02/15 1747  AST 12*  ALT 7*  ALKPHOS  43  BILITOT 0.8  PROT 5.6*  ALBUMIN 2.6*   No results for input(s): LIPASE, AMYLASE in the last 168 hours. No results for input(s): AMMONIA in the last 168 hours. CBC:  Recent Labs Lab 06/02/15 1747 06/03/15 0525 06/03/15 0931 06/04/15 0700 06/05/15 0432 06/07/15 0427 06/08/15 0426  WBC 16.4* 11.6*  --  11.2* 10.4 15.0* 17.2*  NEUTROABS 14.5*  --   9.8* 9.0*  --   --   --   HGB 10.5* 8.6*  --  9.2* 8.5* 8.3* 9.0*  HCT 32.3* 27.0*  --  29.0* 25.6* 27.1* 28.2*  MCV 95.6 95.1  --  96.3 95.9 98.2 98.3  PLT 149* 140*  --  150 143* 155 173   Cardiac Enzymes: No results for input(s): CKTOTAL, CKMB, CKMBINDEX, TROPONINI in the last 168 hours. BNP (last 3 results) No results for input(s): BNP in the last 8760 hours.  ProBNP (last 3 results) No results for input(s): PROBNP in the last 8760 hours.  CBG:  Recent Labs Lab 06/01/15 1253  GLUCAP 81    Recent Results (from the past 240 hour(s))  Culture, blood (routine x 2)     Status: None   Collection Time: 06/02/15  5:30 PM  Result Value Ref Range Status   Specimen Description BLOOD RIGHT ANTECUBITAL  Final   Special Requests BOTTLES DRAWN AEROBIC AND ANAEROBIC 5 ML  Final   Culture   Final    NO GROWTH 5 DAYS Performed at [facility]    Report Status 06/07/2015 FINAL  Final  Culture, blood (routine x 2)     Status: None   Collection Time: 06/02/15  6:51 PM  Result Value Ref Range Status   Specimen Description BLOOD RIGHT HAND  Final   Special Requests BOTTLES DRAWN AEROBIC ONLY 8 CC  Final   Culture   Final    NO GROWTH 5 DAYS Performed at [facility]    Report Status 06/07/2015 FINAL  Final  C difficile quick scan w PCR reflex     Status: None   Collection Time: 06/04/15  3:55 PM  Result Value Ref Range Status   C Diff antigen NEGATIVE NEGATIVE Final   C Diff toxin NEGATIVE NEGATIVE Final   C Diff interpretation Negative for toxigenic C. difficile  Final     Studies: No results found.  Scheduled Meds: . cefTRIAXone (ROCEPHIN)  IV  1 g Intravenous Q24H  . enoxaparin (LOVENOX) injection  30 mg Subcutaneous Q24H  . feeding supplement  1 Container Oral BID BM  . ferrous sulfate  325 mg Oral BID WC  . mirtazapine  15 mg Oral QHS   Continuous Infusions:     Time spent: 25 minutes    Carly Schwartz  [facility] Pager [phone] If  7PM-7AM, please contact night-coverage at [email or web address], password [facility] 06/08/2015, 10:32 AM  LOS: 6 days

## 2015-06-08 NOTE — Discharge Planning (Signed)
BSW met with pt at bedside.  BSW offered bed offers to pt for SNF.  Pt stated boyfriend is coming later today and would like to speak with boyfriend about bed offers.  BSW requested pt nurse, Rollene Fare to call when pt boyfriend arrives today.  BSW to follow-up with pt about SNF choice.   Harlon Flor, [place] Intern

## 2015-06-08 NOTE — Care Management Important Message (Signed)
Important Message  Patient Details  Name: ANNAKATE SOULIER MRN: [id number] Date of Birth: 22-Sep-1945   Medicare Important Message Given:  [facility] notification given    Camillo Flaming 06/08/2015, 11:53 AMImportant Message  Patient Details  Name: SHEWANDA SHARPE MRN: [id number] Date of Birth: Feb 22, 1946   Medicare Important Message Given:  Yes-second notification given    Camillo Flaming 06/08/2015, 11:52 AM

## 2015-06-09 ENCOUNTER — Ambulatory Visit
Admit: 2015-06-09 | Discharge: 2015-06-09 | Disposition: A | Discharge status: Home / Self Care | Payer: Medicare Other | Attending: Radiation Oncology | Admitting: Radiation Oncology

## 2015-06-09 DIAGNOSIS — N390 Urinary tract infection, site not specified: Secondary | ICD-10-CM

## 2015-06-09 DIAGNOSIS — E876 Hypokalemia: Secondary | ICD-10-CM

## 2015-06-09 LAB — URINE MICROSCOPIC-ADD ON

## 2015-06-09 LAB — URINALYSIS, ROUTINE W REFLEX MICROSCOPIC
BILIRUBIN URINE: NEGATIVE
GLUCOSE, UA: NEGATIVE mg/dL
KETONES UR: 40 mg/dL — AB
Nitrite: NEGATIVE
PROTEIN: 30 mg/dL — AB
Specific Gravity, Urine: 1.015 (ref 1.005–1.030)
UROBILINOGEN UA: 0.2 mg/dL (ref 0.0–1.0)
pH: 6 (ref 5.0–8.0)

## 2015-06-09 LAB — LACTIC ACID, PLASMA
LACTIC ACID, VENOUS: 1.5 mmol/L (ref 0.5–2.0)
Lactic Acid, Venous: 1.1 mmol/L (ref 0.5–2.0)

## 2015-06-09 MED ORDER — KCL IN DEXTROSE-NACL 40-5-0.45 MEQ/L-%-% IV SOLN
INTRAVENOUS | Status: DC
  Administered 2015-06-09: 125 mL/h via INTRAVENOUS
  Filled 2015-06-09 (×6): qty 1000

## 2015-06-09 MED ORDER — ONDANSETRON HCL 4 MG/2ML IJ SOLN
4.0000 mg | Freq: Three times a day (TID) | INTRAMUSCULAR | Status: DC
  Administered 2015-06-10 – 2015-06-14 (×13): 4 mg via INTRAVENOUS
  Filled 2015-06-09 (×13): qty 2

## 2015-06-09 MED ORDER — MIRTAZAPINE 30 MG PO TABS
30.0000 mg | ORAL_TABLET | Freq: Every day | ORAL | Status: DC
  Filled 2015-06-09: qty 1

## 2015-06-09 MED ORDER — SODIUM CHLORIDE 0.9 % IV SOLN
INTRAVENOUS | Status: DC
  Administered 2015-06-09 (×2): via INTRAVENOUS

## 2015-06-09 MED ORDER — PIPERACILLIN-TAZOBACTAM 3.375 G IVPB
3.3750 g | Freq: Three times a day (TID) | INTRAVENOUS | Status: DC
  Administered 2015-06-09 – 2015-06-11 (×5): 3.375 g via INTRAVENOUS
  Filled 2015-06-09 (×6): qty 50

## 2015-06-09 MED ORDER — SODIUM CHLORIDE 0.9 % IV BOLUS (SEPSIS)
1000.0000 mL | Freq: Once | INTRAVENOUS | Status: AC
  Administered 2015-06-09: 1000 mL via INTRAVENOUS

## 2015-06-09 NOTE — Progress Notes (Signed)
CSW continuing to follow.   CSW followed up with pt at bedside to check with pt about if pt has made a decision about SNF upon discharge.  Pt reports that pt significant other took list home and was going to look into options. Pt reports that pt significant other will be back at hospital later today. Pt agreeable to CSW following up at that time.  CSW to follow up with pt and pt significant other at a later time to discuss decision regarding SNF placement.  Per MD, pt not yet medically ready for discharge.  CSW to continue to follow to provide support and assist with pt disposition needs.   Alison Murray, MSW, [place] [employer] [phone]

## 2015-06-09 NOTE — Progress Notes (Signed)
Hematology-Oncology Attempted to see patient twice today. She was away initially and later she was on bedpan and did not want to be disturbed. I reviewed the chart and asked our nurses to inform the patient's family to come at 4:30 PM tomorrow to discuss her care plan. 1. Metastatic vulvar cancer: On palliative XRT 2. Options to be discussed tomorrow include Palliative chemo with Cisplatin/carboplatin vs Hospice care Thanks Dr.[place] Lindi Adie

## 2015-06-09 NOTE — Progress Notes (Signed)
ANTIBIOTIC CONSULT NOTE - INITIAL  Pharmacy Consult for Zosyn Indication: sepsis  No Known Allergies  Patient Measurements: Height: 5\' 2"  (157.5 cm) Weight: 106 lb 7.7 oz (48.3 kg) IBW/kg (Calculated) : 50.1   Vital Signs: Temp: 98.2 F (36.8 C) (10/05 1435) Temp Source: Oral (10/05 1435) BP: 81/54 mmHg (10/05 1435) Pulse Rate: 105 (10/05 1435) Intake/Output from previous day: 10/04 0701 - 10/05 0700 In: -  Out: 200 [Urine:200] Intake/Output from this shift:    Labs:  Recent Labs  06/07/15 0427 06/08/15 0426  WBC 15.0* 17.2*  HGB 8.3* 9.0*  PLT 155 173  CREATININE  --  0.57   Estimated Creatinine Clearance: 50.6 mL/min (by C-G formula based on Cr of 0.57). No results for input(s): VANCOTROUGH, VANCOPEAK, VANCORANDOM, GENTTROUGH, GENTPEAK, GENTRANDOM, TOBRATROUGH, TOBRAPEAK, TOBRARND, AMIKACINPEAK, AMIKACINTROU, AMIKACIN in the last 72 hours.   Microbiology: Recent Results (from the past 720 hour(s))  Blood Culture (routine x 2)     Status: None   Collection Time: 05/18/15  7:30 PM  Result Value Ref Range Status   Specimen Description BLOOD LEFT FOREARM  Final   Special Requests BOTTLES DRAWN AEROBIC AND ANAEROBIC 3 ML  Final   Culture   Final    NO GROWTH 5 DAYS Performed at [facility]    Report Status 05/23/2015 FINAL  Final  Blood Culture (routine x 2)     Status: None   Collection Time: 05/18/15  8:05 PM  Result Value Ref Range Status   Specimen Description BLOOD RIGHT ANTECUBITAL  Final   Special Requests BOTTLES DRAWN AEROBIC AND ANAEROBIC 5 ML  Final   Culture   Final    NO GROWTH 5 DAYS Performed at [facility]    Report Status 05/23/2015 FINAL  Final  Urine culture     Status: None   Collection Time: 05/19/15  4:10 AM  Result Value Ref Range Status   Specimen Description URINE, RANDOM  Final   Special Requests NONE  Final   Culture MULTIPLE SPECIES PRESENT, SUGGEST RECOLLECTION  Final   Report Status 05/21/2015 FINAL  Final   MRSA PCR Screening     Status: None   Collection Time: 05/19/15  6:22 AM  Result Value Ref Range Status   MRSA by PCR NEGATIVE NEGATIVE Final    Comment:        The GeneXpert MRSA Assay (FDA approved for NASAL specimens only), is one component of a comprehensive MRSA colonization surveillance program. It is not intended to diagnose MRSA infection nor to guide or monitor treatment for MRSA infections.   C difficile quick scan w PCR reflex     Status: None   Collection Time: 05/21/15  1:55 AM  Result Value Ref Range Status   C Diff antigen NEGATIVE NEGATIVE Final   C Diff toxin NEGATIVE NEGATIVE Final   C Diff interpretation Negative for toxigenic C. difficile  Final  Culture, blood (routine x 2)     Status: None   Collection Time: 06/02/15  5:30 PM  Result Value Ref Range Status   Specimen Description BLOOD RIGHT ANTECUBITAL  Final   Special Requests BOTTLES DRAWN AEROBIC AND ANAEROBIC 5 ML  Final   Culture   Final    NO GROWTH 5 DAYS Performed at [facility]    Report Status 06/07/2015 FINAL  Final  Culture, blood (routine x 2)     Status: None   Collection Time: 06/02/15  6:51 PM  Result Value Ref Range  Status   Specimen Description BLOOD RIGHT HAND  Final   Special Requests BOTTLES DRAWN AEROBIC ONLY 8 CC  Final   Culture   Final    NO GROWTH 5 DAYS Performed at [facility]    Report Status 06/07/2015 FINAL  Final  C difficile quick scan w PCR reflex     Status: None   Collection Time: 06/04/15  3:55 PM  Result Value Ref Range Status   C Diff antigen NEGATIVE NEGATIVE Final   C Diff toxin NEGATIVE NEGATIVE Final   C Diff interpretation Negative for toxigenic C. difficile  Final    Medical History: Past Medical History  Diagnosis Date  . Hypertension   . Vulvar cancer     Assessment: 10 yoF with history of recently diagnosed metastatic squamous cell carcinoma admitted with UTI, hypotension, general weakness.  She has been on Ceftriaxone but  WBC trending up and remains hypotensive so switching to Zosyn.  SCr stable.  CrCl~50 ml/min.  Goal of Therapy:  Doses adjusted per renal function Eradication of infection  Plan:  Zosyn 3.375g IV q8h (4 hour infusion time).  Since SCr has remained stable, do not anticipate further adjustments to dose so pharmacy will sign off.  Please re-consult if needed.   Hershal Coria 06/09/2015,7:16 PM

## 2015-06-09 NOTE — Progress Notes (Addendum)
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 Recently hospitalized for suspected colitis when she was found to have the vulvar mass. Since being discharged home she has ongoing nausea and poor appetite with weakness. She was referred to the ED from radiation oncology clinic where she was being evaluated for palliative radiation. He is in the ED she was found to have orthostatic hypotension with SBP in the 80s. UA was positive for UTI. Patient transferred to was to the hospital to start radiation.  Assessment/Plan  UTI with hypotension and generalized weakness -Urine analysis on 05/25/2015 showing presence of bacteria with pyuria -She remains afebrile, but her WBC is trending up and she remains hypotensive - repeat UA and urine culture -  D/cceftriaxone and start zosyn  Hypotension -  Increase IVF -  Cortisol level was 19/normal a few days ago -  Could be sign of severity of illness and impending death -  Patient on bedpan today and will have conversation about DNR/DNI and hospice tomorrow.  Palliative care consulted.  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 -Pathology revealed squamous cell carcinoma. -A PET scan that was performed on 06/01/2015 showing primary vulvar carcinoma with metastasis to pelvic lymph nodes, lungs and hilar lymph nodes. -She was transferred to [facility] long hospital on 06/04/2015  where she underwent simulation and treatment planning. started radiation therapy on 06/07/2015. - medical oncology consultation pending  -  Palliative care consult pending  Hypokalemia  Likely secondary to severe dehydration.  -repeat in AM  Hypomagnesemia -  Repeat in AM  Leukocytosis may be due to untreated infection, no evidence of pneumonia.  May be UTI resistent to ceftriaxone.  May also be due to anemia or due to malignancy. - change abx as above -  Repeat CBC in AM -  No recent steroids as far as I can tell  Iron deficiency anemia - Likely secondary to anemia of chronic disease/malignancy - would consider blood transfusion if hemoglobin continues to trend down -  Repeat CBC in AM  Generalized weakness/deconditioning -Physical therapy has been consulted- SNF   Diet:  regular Access:  PIV IVF:  yes Proph:  lovenox  Code Status: full code >> need ongoing conversations about code status.   Family Communication: patient alone Disposition Plan:  Unclear disposition.  If she remains hypotensive and does not respond to escalated antibiotics, this may be sign of impending death.  SNF with hospice vs. Residential hospice potentially depending on progression.     Consultants:  Palliative care  Radiation oncology  Medical oncology  Followed by Dr. Denman George from [place]  Procedures:  None  Antibiotics:  IV Rocephin 9/29--10/5  Start zosyn 10/5 >   HPI/Subjective:  Having severe nausea and ongoing vomiting.  No appetite and feels she has not eaten or drank anything recently  Asks that medications be given through her IV.  Voiding frequently in bedpan.    Objective: Filed Vitals:   06/08/15 1251 06/08/15 2154 06/09/15  0350 06/09/15 1435  BP: 91/54 101/80 86/57 81/54   Pulse: 110 110 115 105  Temp: 98.1 F (36.7 C) 97.4 F (36.3 C) 97.8 F (36.6 C) 98.2 F (36.8 C)  TempSrc: Oral Oral Oral Oral  Resp: 16 20 16 20   Height:      Weight:      SpO2: 98% 96%  95% 95%    Intake/Output Summary (Last 24 hours) at 06/09/15 1824 Last data filed at 06/09/15 1057  Gross per 24 hour  Intake     50 ml  Output    200 ml  Net   -150 ml   Filed Weights   06/03/15 0320  Weight: 48.3 kg (106 lb 7.7 oz)   Body mass index is 19.47 kg/(m^2).  Exam:   General:  Cachectic female, No acute distress  HEENT:  NCAT, MMM  Cardiovascular:  Tachycardic RR, nl S1, S2 no mrg, 2+ pulses, warm extremities  Respiratory:  CTAB, no increased WOB  Abdomen:   NABS, soft, NT/ND  MSK:   Normal tone and bulk, no LEE  Neuro:  Grossly intact  Data Reviewed: Basic Metabolic Panel:  Recent Labs Lab 06/03/15 0914 06/04/15 0700 06/05/15 0432 06/06/15 0358 06/08/15 0426  NA 137 139 137 134* 138  K 3.1* 3.2* 3.0* 4.6 3.6  CL 104 109 109 110 112*  CO2 21* 20* 21* 18* 17*  GLUCOSE 85 64* 73 74 71  BUN <5* <5* <5* <5* <5*  CREATININE 0.87 0.65 0.49 0.55 0.57  CALCIUM 9.0 8.4* 8.4* 8.6* 9.3  MG 1.6* 1.9  --   --   --    Liver Function Tests: No results for input(s): AST, ALT, ALKPHOS, BILITOT, PROT, ALBUMIN in the last 168 hours. No results for input(s): LIPASE, AMYLASE in the last 168 hours. No results for input(s): AMMONIA in the last 168 hours. CBC:  Recent Labs Lab 06/03/15 0525 06/03/15 0931 06/04/15 0700 06/05/15 0432 06/07/15 0427 06/08/15 0426  WBC 11.6*  --  11.2* 10.4 15.0* 17.2*  NEUTROABS  --  9.8* 9.0*  --   --   --   HGB 8.6*  --  9.2* 8.5* 8.3* 9.0*  HCT 27.0*  --  29.0* 25.6* 27.1* 28.2*  MCV 95.1  --  96.3 95.9 98.2 98.3  PLT 140*  --  150 143* 155 173    Recent Results (from the past 240 hour(s))  Culture, blood (routine x 2)     Status: None   Collection Time: 06/02/15  5:30 PM  Result Value Ref Range Status   Specimen Description BLOOD RIGHT ANTECUBITAL  Final   Special Requests BOTTLES DRAWN AEROBIC AND ANAEROBIC 5 ML  Final   Culture   Final    NO GROWTH 5 DAYS Performed at [facility]    Report Status  06/07/2015 FINAL  Final  Culture, blood (routine x 2)     Status: None   Collection Time: 06/02/15  6:51 PM  Result Value Ref Range Status   Specimen Description BLOOD RIGHT HAND  Final   Special Requests BOTTLES DRAWN AEROBIC ONLY 8 CC  Final   Culture   Final    NO GROWTH 5 DAYS Performed at [facility]    Report Status 06/07/2015 FINAL  Final  C difficile quick scan w PCR reflex     Status: None   Collection Time: 06/04/15  3:55 PM  Result Value Ref Range Status   C Diff antigen NEGATIVE NEGATIVE Final  C Diff toxin NEGATIVE NEGATIVE Final   C Diff interpretation Negative for toxigenic C. difficile  Final     Studies: No results found.  Scheduled Meds: . cefTRIAXone (ROCEPHIN)  IV  1 g Intravenous Q24H  . enoxaparin (LOVENOX) injection  30 mg Subcutaneous Q24H  . feeding supplement  1 Container Oral BID BM  . ferrous sulfate  325 mg Oral BID WC  . mirtazapine  15 mg Oral QHS   Continuous Infusions: . sodium chloride 125 mL/hr at 06/09/15 1635    Active Problems:   UTI (lower urinary tract infection)   Hypokalemia   Sepsis ([place])   Vulvar cancer, carcinoma (HCC)   Protein-calorie malnutrition, severe (HCC)   Iron deficiency anemia    Time spent: 30 min    Orel Cooler, [facility] Pager [phone]. If 7PM-7AM, please contact night-coverage at [email or web address], password [facility] 06/09/2015, 6:24 PM  LOS: 7 days

## 2015-06-10 ENCOUNTER — Ambulatory Visit
Admit: 2015-06-10 | Discharge: 2015-06-10 | Disposition: A | Discharge status: Home / Self Care | Payer: Medicare Other | Attending: Radiation Oncology | Admitting: Radiation Oncology

## 2015-06-10 DIAGNOSIS — C778 Secondary and unspecified malignant neoplasm of lymph nodes of multiple regions: Secondary | ICD-10-CM

## 2015-06-10 DIAGNOSIS — Z720 Tobacco use: Secondary | ICD-10-CM

## 2015-06-10 DIAGNOSIS — Z515 Encounter for palliative care: Secondary | ICD-10-CM | POA: Insufficient documentation

## 2015-06-10 DIAGNOSIS — R197 Diarrhea, unspecified: Secondary | ICD-10-CM

## 2015-06-10 DIAGNOSIS — D509 Iron deficiency anemia, unspecified: Secondary | ICD-10-CM

## 2015-06-10 DIAGNOSIS — A419 Sepsis, unspecified organism: Secondary | ICD-10-CM

## 2015-06-10 DIAGNOSIS — E43 Unspecified severe protein-calorie malnutrition: Secondary | ICD-10-CM

## 2015-06-10 DIAGNOSIS — C7982 Secondary malignant neoplasm of genital organs: Secondary | ICD-10-CM

## 2015-06-10 LAB — COMPREHENSIVE METABOLIC PANEL
ALBUMIN: 1.8 g/dL — AB (ref 3.5–5.0)
ALK PHOS: 43 U/L (ref 38–126)
ALT: 6 U/L — AB (ref 14–54)
AST: 10 U/L — AB (ref 15–41)
Anion gap: 6 (ref 5–15)
BUN: <5 mg/dL — ABNORMAL LOW (ref 6–20)
CALCIUM: 9.4 mg/dL (ref 8.9–10.3)
CHLORIDE: 113 mmol/L — AB (ref 101–111)
CO2: 19 mmol/L — AB (ref 22–32)
CREATININE: 0.61 mg/dL (ref 0.44–1.00)
GFR calc Af Amer: >60 mL/min (ref >60)
GFR calc non Af Amer: >60 mL/min (ref >60)
GLUCOSE: 116 mg/dL — AB (ref 65–99)
Potassium: 3.5 mmol/L (ref 3.5–5.1)
SODIUM: 138 mmol/L (ref 135–145)
Total Bilirubin: 0.8 mg/dL (ref 0.3–1.2)
Total Protein: 4.2 g/dL — ABNORMAL LOW (ref 6.5–8.1)

## 2015-06-10 LAB — CBC WITH DIFFERENTIAL/PLATELET
BASOS PCT: 0 %
Basophils Absolute: 0 10*3/uL (ref 0.0–0.1)
EOS ABS: 1.1 10*3/uL — AB (ref 0.0–0.7)
Eosinophils Relative: 7 %
HEMATOCRIT: 26.8 % — AB (ref 36.0–46.0)
HEMOGLOBIN: 8.4 g/dL — AB (ref 12.0–15.0)
LYMPHS ABS: 0.6 10*3/uL — AB (ref 0.7–4.0)
Lymphocytes Relative: 4 %
MCH: 31.6 pg (ref 26.0–34.0)
MCHC: 31.3 g/dL (ref 30.0–36.0)
MCV: 100.8 fL — ABNORMAL HIGH (ref 78.0–100.0)
MONO ABS: 0.7 10*3/uL (ref 0.1–1.0)
MONOS PCT: 4 %
NEUTROS PCT: 85 %
Neutro Abs: 13.3 10*3/uL — ABNORMAL HIGH (ref 1.7–7.7)
Platelets: 194 10*3/uL (ref 150–400)
RBC: 2.66 MIL/uL — ABNORMAL LOW (ref 3.87–5.11)
RDW: 20.9 % — AB (ref 11.5–15.5)
WBC: 15.7 10*3/uL — ABNORMAL HIGH (ref 4.0–10.5)

## 2015-06-10 MED ORDER — DEXAMETHASONE SODIUM PHOSPHATE 4 MG/ML IJ SOLN
4.0000 mg | Freq: Two times a day (BID) | INTRAMUSCULAR | Status: DC
  Administered 2015-06-10 – 2015-06-14 (×9): 4 mg via INTRAVENOUS
  Filled 2015-06-10 (×9): qty 1

## 2015-06-10 NOTE — Progress Notes (Signed)
Thank you for consulting the Palliative Medicine Team at [facility] to meet your patient's and family's needs.   The reason that you asked [place] to see your patient is  For Establishing GOC  We have scheduled your patient for a meeting: Tomorrow Friday 06-11-15 at 230 pm with [place] NP  Palliative Medicine Team Team Phone # [phone] Pager [phone]

## 2015-06-10 NOTE — Progress Notes (Signed)
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 Recently hospitalized for suspected colitis when she was found to have the vulvar mass. Since being discharged home she has ongoing nausea and poor appetite with weakness. She was referred to the ED from radiation oncology clinic where she was being evaluated for palliative radiation. He is in the ED she was found to have orthostatic hypotension with SBP in the 80s. UA was positive for UTI. Patient transferred to was to the hospital to start radiation.  Assessment/Plan  Sepsis secondary to UTI with leukocytosis, tachycardia, hypotension and generalized weakness -Urine analysis on 05/25/2015 showing presence of bacteria with pyuria -She remains afebrile, but her WBC is trending up and she remains hypotensive - repeat UA and urine culture -  continue zosyn day 2  Hypotension, not fluid responsive but improved somewhat after changing antibiotics -  D/c IVF -  Cortisol level was 19/normal a few days ago -  Could be sign of severity of illness  Nausea, vomiting and anorexia, not eating or drinking anything.  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 -Pathology revealed squamous cell carcinoma. -A PET scan that was performed on 06/01/2015 showing primary vulvar carcinoma with metastasis to pelvic lymph nodes, lungs and hilar lymph nodes. -She  was transferred to [facility] hospital on 06/04/2015 where she underwent simulation and treatment planning. started radiation therapy on 06/07/2015. - medical oncology consultation pending  -  Palliative care consult pending  Flat and appears depressed but not willing to take oral medications  Hypokalemia resolved with supplementation  Hypomagnesemia, resolved with supplementation  Leukocytosis may be due to untreated infection, no evidence of pneumonia.  May be UTI resistent to ceftriaxone.  May also be due to anemia or due to malignancy.  Trending down slightly -  Change abx as above -  Repeat CBC in AM -  No recent steroids as far as I can tell  Iron deficiency anemia - Likely secondary to anemia of chronic disease/malignancy - would consider blood transfusion if hemoglobin continues to trend down -  Repeat CBC in AM -  D/c oral iron  Generalized weakness/deconditioning -Physical therapy has been consulted- SNF  Anasarca with albumin 1.8 -  D/c IVF -  Hold on lasix since not taking anything PO  Diet:  regular Access:  PIV IVF:  off Proph:  lovenox  Code Status: full code >> need conversation about code status.   Family Communication: patient alone Disposition Plan:  She is not eating or drinking and is hypotensive with ongoing nausea and metastatic cancer/bedbound.  If she continues to not eat or drink, then she would decline very quickly and may be a candidate for residential hospice.       Consultants:  Palliative care  Radiation oncology  Medical oncology  Followed by Dr. Denman George from [place]  Procedures:  None  Antibiotics:  IV Rocephin 9/29--10/5  Start zosyn 10/5 >   HPI/Subjective:  Having severe nausea and refusing all oral medications.  No appetite and has not had much to eat or drink in many days.  voiding frequently in bedpan.  Becoming swollen.    Objective: Filed Vitals:   06/09/15 1435 06/09/15 2121 06/10/15 0641 06/10/15 0733  BP:  81/54 94/57 95/59    Pulse: 105 109 101   Temp: 98.2 F (36.8 C) 97.9 F (36.6 C) 96 F (35.6 C) 97.8 F (36.6 C)  TempSrc: Oral Axillary Axillary Axillary  Resp: 20 20 20    Height:      Weight:      SpO2: 95% 94% 92%     Intake/Output Summary (Last 24 hours) at 06/10/15 1406 Last data filed at 06/10/15 0643  Gross per 24 hour  Intake      0 ml  Output    205 ml  Net   -205 ml   Filed Weights   06/03/15 0320  Weight: 48.3 kg (106 lb 7.7 oz)   Body mass index is 19.47 kg/(m^2).  Exam:   General:  Cachectic female, No acute distress  HEENT:  NCAT, MMM  Cardiovascular:  Tachycardic RR, nl S1, S2 no mrg, 2+ pulses, warm extremities  Respiratory:  CTAB, no increased WOB  Abdomen:   NABS, soft, NT/ND  MSK:   Normal tone and bulk, 2+ soft pitting bilateral LEE  Neuro:  Grossly intact  Data Reviewed: Basic Metabolic Panel:  Recent Labs Lab 06/04/15 0700 06/05/15 0432 06/06/15 0358 06/08/15 0426 06/10/15 0420  NA 139 137 134* 138 138  K 3.2* 3.0* 4.6 3.6 3.5  CL 109 109 110 112* 113*  CO2 20* 21* 18* 17* 19*  GLUCOSE 64* 73 74 71 116*  BUN <5* <5* <5* <5* <5*  CREATININE 0.65 0.49 0.55 0.57 0.61  CALCIUM 8.4* 8.4* 8.6* 9.3 9.4  MG 1.9  --   --   --   --    Liver Function Tests:  Recent Labs Lab 06/10/15 0420  AST 10*  ALT 6*  ALKPHOS 43  BILITOT 0.8  PROT 4.2*  ALBUMIN 1.8*   No results for input(s): LIPASE, AMYLASE in the last 168 hours. No results for input(s): AMMONIA in the last 168 hours. CBC:  Recent Labs Lab 06/04/15 0700 06/05/15 0432 06/07/15 0427 06/08/15 0426 06/10/15 0420  WBC 11.2* 10.4 15.0* 17.2* 15.7*  NEUTROABS 9.0*  --   --   --  13.3*  HGB 9.2* 8.5* 8.3* 9.0* 8.4*  HCT 29.0* 25.6* 27.1* 28.2* 26.8*  MCV 96.3 95.9 98.2 98.3 100.8*  PLT 150 143* 155 173 194    Recent Results (from the past 240 hour(s))  Culture, blood (routine x 2)     Status: None   Collection Time: 06/02/15  5:30 PM  Result Value Ref Range  Status   Specimen Description BLOOD RIGHT ANTECUBITAL  Final   Special Requests BOTTLES DRAWN AEROBIC AND ANAEROBIC 5 ML  Final   Culture   Final    NO GROWTH 5 DAYS Performed at [facility]    Report Status 06/07/2015 FINAL  Final  Culture, blood (routine x 2)     Status: None   Collection Time: 06/02/15  6:51 PM  Result Value Ref Range Status   Specimen Description BLOOD RIGHT HAND  Final   Special Requests BOTTLES DRAWN AEROBIC ONLY 8 CC  Final   Culture   Final    NO GROWTH 5 DAYS Performed at [facility]    Report Status  06/07/2015 FINAL  Final  C difficile quick scan w PCR reflex     Status: None   Collection Time: 06/04/15  3:55 PM  Result Value Ref Range Status   C Diff antigen NEGATIVE NEGATIVE Final   C Diff toxin NEGATIVE NEGATIVE Final   C Diff interpretation Negative for toxigenic C. difficile  Final     Studies: No results found.  Scheduled Meds: . enoxaparin (LOVENOX) injection  30 mg Subcutaneous Q24H  . feeding supplement  1 Container Oral BID BM  . ferrous sulfate  325 mg Oral BID WC  . mirtazapine  30 mg Oral QHS  . ondansetron (ZOFRAN) IV  4 mg Intravenous TID AC  . piperacillin-tazobactam (ZOSYN)  IV  3.375 g Intravenous Q8H   Continuous Infusions: . dextrose 5 % and 0.45 % NaCl with KCl 40 mEq/L Stopped (06/10/15 0900)    Active Problems:   UTI (lower urinary tract infection)   Hypokalemia   Sepsis ([place])   Vulvar cancer, carcinoma (HCC)   Protein-calorie malnutrition, severe ([place])   Iron deficiency anemia   Palliative care encounter    Time spent: 30 min    Farouk Vivero, [place] [facility] Pager [phone]. If 7PM-7AM, please contact night-coverage at [email or web address], password [facility] 06/10/2015, 2:06 PM  LOS: 8 days

## 2015-06-10 NOTE — Progress Notes (Signed)
PT Cancellation Note  Patient Details Name: BLONDINA CODERRE MRN: [id number] DOB: [birth year]   Cancelled Treatment:    Reason Eval/Treat Not Completed: Pain limiting ability to participate (states  area where she has cancer is too painful to try to sit up. Has not been able to participate on days that PT has offered. PT pager number on board for RN to page if patient  is able to get OOB> )   [name] 06/10/2015, 9:23 AM Tresa Endo PT [phone]

## 2015-06-10 NOTE — Consult Note (Signed)
MEDICAL ONCOLOGY   Reason for Consult:metastatic squamous cell carcinoma of vulva Referring Physician: [facility] day Zosyn day 2, previously on Rocephin  Carly Schwartz is an 69 y.o. 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 Palliative radiation to primary vulvar involvement is in process by Dr Sondra Come, planned thru 06-25-15.   Patient appears to be sleeping thru all of my visit today. Her significant other is in room and we have reviewed history and discussed situation in detail.  Patient had bleeding from vulvar area for a month or longer prior to presenting to ED initially 04-18-15 with urosepsis, that admission thru 04-20-15 with additional finding of bilateral pulmonary nodules. She was readmitted 9-13 thru 05-22-15 with hypotension and diarrhea, with finding of large vulvar mass when foley catheter placement was attempted; C diff was negative. She was seen in consultation by Dr Denman George, with biopsy confirming vulvar squamous cell carcinoma,, clinically stage IV disease. She had PET 06-01-15, with hypermetabolic adenopathy mediastinum, bilateral hilar, left external iliac and left inguinal nodes as well as in bilateral pulmonary nodules (which had increased in size and number compared with scan 04-20-15) and in vulva. She was seen in consultation by Dr Sondra Come on 06-02-15, however was acutely ill and hypotensive such that she was admitted again to hospital that day, and remains hospitalized.  She began radiation by Dr Sondra Come, planned 30 Gy in 15 fractions with possible additional boost doses.  Per patient's significant other, she has lost at least 35 lbs in last few months, with very poor po intake during the short intervals that she was at home between hospitalizations, and very poor intake this hospitalization. It is not  clear to boyfriend why she is not eating or drinking, tho RN reports some complaints of nausea, gave IV antiemetics today following which patient stated abdomen "felt better" tho still refused all po's. Boyfriend tells me that she does not complain of pain other than unable to sit, and does not complain of SOB tho has had slight cough. She has been long time smoker, DCd with hospitalizations and does not seem to have difficulty being off cigarettes. RN tells me that patient refuses all oral medications tho will take IV. She also refuses sitz baths. Hospital chaplain has visited several times, without any apparent improvement in depressive symptoms.  Boyfriend tells me that he is to meet with someone tomorrow about plans after hospital, possibly palliative care team.  ROS no other apparent bleeding. Swelling right upper arm, tho peripheral IV in forearm seems to be infusing adequately. Small watery stool today per RN. Patient does not allow oral care. She is transported to radiation oncology in bed, does not tolerate up in chair.  Allergies: No Known Allergies She has not had primary MD prior to recent hospitalizations Past Medical History  Diagnosis Date  . Hypertension   . Vulvar cancer     Past Surgical History  Procedure Laterality Date  . Tubal ligation      Family History  Problem Relation Age of Onset  . Uterine cancer Mother   . Cancer Brother     Social History: Tobacco as above. Originally from [place], has been in [place] since 1980s. Lives in [place] with boyfriend and 57 yo granddaughter, who has lived with her since she was a baby. 2 sons and 2 daughters also in  [place]. 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 Boyfriend presently unemployed. Granddaughter is at school for ~ an hour daily.  Medications: reviewed in EMR. She refused antidepressant and all oral  medications  Blood pressure 89/55, pulse 100, temperature 97.4 F (36.3 C), temperature source Axillary, resp. rate 20, height 5' 2"  (1.575 m), weight 106 lb 7.7 oz (48.3 kg), SpO2 94 %. Physical Exam Frail, elderly appearing lady lying supine with eyes closed, pale, not icteric. Respirations not labored RA. She does not respond to direct verbal communication and does not move or acknowledge gentle exam. Boyfriend tells me that she spoke with him shortly prior to my visit.  Will not open mouth. No supraclavicular adenopathy. Bilateral crackles and slight expiratory wheezes anteriorly. Heart RRR. Abdomen soft, not distended, quiet, seems nontender. No obvious HSM or mass. Cannot examine perineum. 2+ swelling right upper arm with peripheral IV in forearm. 1+ pedal edema bilaterally.     Results for orders placed or performed during the hospital encounter of 06/02/15 (from the past 48 hour(s))  Lactic acid, plasma     Status: None   Collection Time: 06/09/15  7:39 PM  Result Value Ref Range   Lactic Acid, Venous 1.5 0.5 - 2.0 mmol/L  Urinalysis, Routine w reflex microscopic (not at [facility])     Status: Abnormal   Collection Time: 06/09/15  7:41 PM  Result Value Ref Range   Color, Urine YELLOW YELLOW   APPearance CLOUDY (A) CLEAR   Specific Gravity, Urine 1.015 1.005 - 1.030   pH 6.0 5.0 - 8.0   Glucose, UA NEGATIVE NEGATIVE mg/dL   Hgb urine dipstick LARGE (A) NEGATIVE   Bilirubin Urine NEGATIVE NEGATIVE   Ketones, ur 40 (A) NEGATIVE mg/dL   Protein, ur 30 (A) NEGATIVE mg/dL   Urobilinogen, UA 0.2 0.0 - 1.0 mg/dL   Nitrite NEGATIVE NEGATIVE   Leukocytes, UA MODERATE (A) NEGATIVE  Urine microscopic-add on     Status: Abnormal   Collection Time: 06/09/15  7:41 PM  Result Value Ref Range   Squamous Epithelial / LPF FEW (A) RARE   WBC, UA TOO NUMEROUS TO COUNT <3 WBC/hpf   RBC / HPF TOO NUMEROUS TO COUNT <3 RBC/hpf   Bacteria, UA MANY (A) RARE   Urine-Other FEW YEAST   Lactic acid, plasma      Status: None   Collection Time: 06/09/15 10:50 PM  Result Value Ref Range   Lactic Acid, Venous 1.1 0.5 - 2.0 mmol/L  Comprehensive metabolic panel     Status: Abnormal   Collection Time: 06/10/15  4:20 AM  Result Value Ref Range   Sodium 138 135 - 145 mmol/L   Potassium 3.5 3.5 - 5.1 mmol/L   Chloride 113 (H) 101 - 111 mmol/L   CO2 19 (L) 22 - 32 mmol/L   Glucose, Bld 116 (H) 65 - 99 mg/dL   BUN <5 (L) 6 - 20 mg/dL   Creatinine, Ser 0.61 0.44 - 1.00 mg/dL   Calcium 9.4 8.9 - 10.3 mg/dL   Total Protein 4.2 (L) 6.5 - 8.1 g/dL   Albumin 1.8 (L) 3.5 - 5.0 g/dL   AST 10 (L) 15 - 41 U/L   ALT 6 (L) 14 - 54 U/L   Alkaline Phosphatase 43 38 - 126 U/L   Total Bilirubin 0.8 0.3 - 1.2 mg/dL   GFR calc non Af Amer >60 >  60 mL/min   GFR calc Af Amer >60 >60 mL/min    Comment: (NOTE) The eGFR has been calculated using the CKD EPI equation. This calculation has not been validated in all clinical situations. eGFR's persistently <60 mL/min signify possible Chronic Kidney Disease.    Anion gap 6 5 - 15  CBC with Differential/Platelet     Status: Abnormal   Collection Time: 06/10/15  4:20 AM  Result Value Ref Range   WBC 15.7 (H) 4.0 - 10.5 K/uL   RBC 2.66 (L) 3.87 - 5.11 MIL/uL   Hemoglobin 8.4 (L) 12.0 - 15.0 g/dL   HCT 26.8 (L) 36.0 - 46.0 %   MCV 100.8 (H) 78.0 - 100.0 fL   MCH 31.6 26.0 - 34.0 pg   MCHC 31.3 30.0 - 36.0 g/dL   RDW 20.9 (H) 11.5 - 15.5 %   Platelets 194 150 - 400 K/uL   Neutrophils Relative % 85 %   Neutro Abs 13.3 (H) 1.7 - 7.7 K/uL   Lymphocytes Relative 4 %   Lymphs Abs 0.6 (L) 0.7 - 4.0 K/uL   Monocytes Relative 4 %   Monocytes Absolute 0.7 0.1 - 1.0 K/uL   Eosinophils Relative 7 %   Eosinophils Absolute 1.1 (H) 0.0 - 0.7 K/uL   Basophils Relative 0 %   Basophils Absolute 0.0 0.0 - 0.1 K/uL   No positive C diff cultures No positive urine or blood cultures that I can find back to 04-18-15 Serum iron 05-22-15 was 23  PATHOLOGY  CINDIE, RAJAGOPALAN  ([id number]) Patient: Waymon Budge A Collected: 05/20/2015 Client: [facility] Accession: [id number] Received: 05/21/2015 Vivianne Master OF SURGICAL PATHOLOGY FINAL DIAGNOSIS Diagnosis Vulva, biopsy, Left vulvar mass - SQUAMOUS CELL CARCINOMA.   RADIOLOGY EXAM: CT CHEST WITH CONTRAST   04-20-15  TECHNIQUE: Multidetector CT imaging of the chest was performed during intravenous contrast administration.  CONTRAST: 75m OMNIPAQUE IOHEXOL 300 MG/ML SOLN  COMPARISON: Chest radiograph on 04/18/2015  FINDINGS: Mediastinum/Lymph Nodes: No masses or pathologically enlarged lymph nodes identified. A moderate hiatal hernia is seen.  Lungs/Pleura: Small to moderate pleural effusions are seen bilaterally with mild dependent atelectasis. Mild emphysema noted.  Three noncalcified pulmonary nodules are seen in the left lower lobe, largest measuring 9 mm. Two pulmonary nodules are seen in the right upper lobe, largest measuring 7 mm.  Musculoskeletal/Soft Tissues: No suspicious bone lesions or other significant chest wall abnormality.  Upper Abdomen: Unremarkable.  IMPRESSION: Several bilateral pulmonary nodules, largest in the left lower lobe measuring 9 mm. Metastatic disease cannot be excluded. Consider PET-CT scan for further evaluation.  Small to moderate bilateral pleural effusions with mild dependent atelectasis.  Mild emphysema.  Moderate hiatal hernia.     CT ABDOMEN AND PELVIS WITH CONTRAST  05-18-15  TECHNIQUE: Multidetector CT imaging of the abdomen and pelvis was performed using the standard protocol following bolus administration of intravenous contrast.  CONTRAST: 266mOMNIPAQUE IOHEXOL 300 MG/ML SOLN, 10057mMNIPAQUE IOHEXOL 300 MG/ML SOLN  COMPARISON: Chest CT 04/20/2015  FINDINGS: Atelectasis/scarring over the posterior right lower lobe. Moderate size hiatal hernia unchanged.  Abdominal images demonstrate the liver,  spleen, pancreas, gallbladder and adrenal glands to be within normal. Kidneys are normal in size without hydronephrosis or nephrolithiasis. There is a sub cm right renal cortical hypodensity too small to characterize but likely a cyst. Ureters are within normal. Appendix is normal.  There is minimal calcified plaque over the abdominal aorta. Small lipoma over the second portion of the duodenum.  There is mild diverticulosis of the sigmoid colon. There is wall thickening of the colon from the splenic flexure to the junction of the sigmoid colon in the left lower quadrant compatible with a mild acute colitis. No evidence of perforation. No adjacent free fluid. Mild mucosal enhancement of several small bowel loops in the pelvis which are nondilated. There is mild engorgement of the adjacent vasa recta as findings may be due to regional enteritis. The small bowel findings as well as the colonic findings together could be seen in inflammatory bowel disease such as Crohn's disease.  Pelvic images demonstrate the bladder, uterus, ovaries and rectum to be within normal. There is a heterogeneous abnormal left inguinal lymph node measuring 3.4 cm in diameter. There is a smaller adjacent low-density lymph node in the left inguinal region. There is a low-density rim enhancing with 2.1 cm left external iliac lymph node. This may be due to metastatic disease in this patient with bilateral pulmonary nodules and pleural effusions. Mild degenerate change of the spine and hips.  IMPRESSION: Colonic wall thickening from the splenic flexure to the junction of the descending colon to sigmoid colon likely representing an acute colitis of infectious or inflammatory nature. Mild mucosal enhancement of several small bowel loops in the pelvis with engorgement of the Vasa recta as findings may be due to a regional enteritis although together with the colonic findings could be seen in inflammatory bowel  disease due to Crohn's.  Abnormal left external iliac and inguinal adenopathy suggesting metastatic disease. Recommend clinical correlation and PET scan versus tissue diagnosis via biopsy of this left inguinal node.  Moderate size hiatal hernia unchanged.    EXAM: NUCLEAR MEDICINE PET SKULL BASE TO THIGH  06-01-15  COMPARISON: AP CT on 05/18/2015 and chest CT on 04/20/2015  FINDINGS: NECK  No hypermetabolic lymph nodes in the neck.  CHEST  Hypermetabolic lymphadenopathy is seen in the mediastinum and bilateral hilar regions. Index mediastinal lymph node and right paratracheal region measures 1.6 cm on image 53/series 4 compared to 7 mm previously. This has a maximum SUV of 13.6.  Hypermetabolic pulmonary nodules are seen bilaterally which are increased in size and number compared to previous study. These are consistent with pulmonary metastases. Index nodule in the superior segment of the left lower lobe measures 16 mm on image 21 of series 8 compared to 9 mm previously, and has an SUV max of 11.5.  Small bilateral pleural effusions are again seen without associated hypermetabolic activity. Small hiatal hernia again noted.  ABDOMEN/PELVIS  No abnormal hypermetabolic activity within the liver, pancreas, adrenal glands, or spleen. No hypermetabolic lymph nodes in the abdomen.  A large lobulated soft tissue mass is seen involving the vulva which was incompletely visualized on previous CT. This measures approximately 5.5 x 9.2 cm on image 178 of series 4, and has an SUV max of 49.9. This is consistent with primary volar carcinoma.  Hypermetabolic lymphadenopathy is seen in the left external iliac chain, with largest lymph node measuring 2.2 cm on image 151 of series 4, with SUV max of 22.0. Hypermetabolic left inguinal lymphadenopathy is also seen measuring 2.8 cm on image 160 of series 4, which has SUV max of 31.9. This lymphadenopathy shows  no significant change in size compared to most recent CT on 05/18/2015.  SKELETON  No focal hypermetabolic activity to suggest skeletal metastasis.  IMPRESSION: Large hypermetabolic vulvar soft tissue mass, consistent with primary vulvar carcinoma.  Hypermetabolic pelvic lymphadenopathy in left external iliac chain  and left inguinal region, consistent with metastatic disease.  Hypermetabolic bilateral pulmonary metastases, increased compared to recent chest CT. Small bilateral pleural effusions also noted.  Increased hypermetabolic mediastinal and bilateral hilar lymphadenopathy, consistent with metastatic disease.   DISCUSSION Above history reviewed. I have been clear that this metastatic cancer cannot be cured, tho hopefully the radiation can give some symptomatic improvement.  I have talked with boyfriend, again in patient's presence tho she appeared asleep/ did not participate, about role of systemic chemotherapy in attempt to palliate and control disease for some period of time. I have explained that patients must have adequate performance status to attempt chemotherapy, and that side effects of treatment can be more problematic if patients are in poor condition. She is clearly not able to attempt chemotherapy now, and would not be candidate for this unless performance status improves significantly.  I discussed usefulness of life support and resuscitation in situations where, given some additional time, it is likely that a person can recover to good quality of life; I have explained that in the case of metastatic and incurable cancer, those aggressive measures would not improve the underlying cancer and could add to morbidity. All of this conversation was in patient's presence, tho she appeared to be asleep and did not participate in the discussion.  Assessment/Plan: 1.Metastatic squamous cell carcinoma of vulva: advanced and symptomatic local involvement metastatic to left  external iliac and inguinal nodes, with bilateral pulmonary nodules and mediastinal and hilar adenopathy consistent with metastatic disease to chest. Palliative radiation in process. Performance status too poor for chemotherapy to be of benefit. She is not receiving pain medication now, which may be needed. Sitz baths usually helpful with local discomfort, including voiding into the sitz bath. Topical viscous lidocaine applied after radiation may be helpful with local discomfort. Antiemetics seemed somewhat helpful today. 2.Protein calorie malnutrition with 35 lb weight loss, refusing all po's. I was not able to examine mouth/ throat, which needs to be done. Not clear if mouth or dental pain or swallowing is a problem in addition to some nausea.  3.tobacco abuse: DCd with hospitalization. Not on nicotine patch 4.poor peripheral IV access: still peripheral IVs  5.severe iron deficiency anemia: may have been from lengthy bleeding from vulvar disease, as she reportedly told boyfriend that she had "bled like this for a long time".  would consider IV iron if trying anything more than strict comfort care 6.diarrhea: C diff negative. May be from radiation. 7.advance directives need to be addressed. Appreciate palliative care team seeing her soon.   I will follow up tomorrow. Please page me if needed prior to my rounds [place] June 10, 2015, 8:24 PM

## 2015-06-11 ENCOUNTER — Ambulatory Visit
Admit: 2015-06-11 | Discharge: 2015-06-11 | Disposition: A | Discharge status: Home / Self Care | Payer: Medicare Other | Attending: Radiation Oncology | Admitting: Radiation Oncology

## 2015-06-11 ENCOUNTER — Inpatient Hospital Stay (HOSPITAL_COMMUNITY): Payer: Medicare Other

## 2015-06-11 DIAGNOSIS — I959 Hypotension, unspecified: Secondary | ICD-10-CM

## 2015-06-11 DIAGNOSIS — Z7189 Other specified counseling: Secondary | ICD-10-CM

## 2015-06-11 DIAGNOSIS — G893 Neoplasm related pain (acute) (chronic): Secondary | ICD-10-CM

## 2015-06-11 DIAGNOSIS — C519 Malignant neoplasm of vulva, unspecified: Principal | ICD-10-CM

## 2015-06-11 LAB — BASIC METABOLIC PANEL
ANION GAP: 8 (ref 5–15)
BUN: <5 mg/dL — ABNORMAL LOW (ref 6–20)
CALCIUM: 10 mg/dL (ref 8.9–10.3)
CO2: 19 mmol/L — AB (ref 22–32)
CREATININE: 0.57 mg/dL (ref 0.44–1.00)
Chloride: 110 mmol/L (ref 101–111)
GFR calc Af Amer: >60 mL/min (ref >60)
GLUCOSE: 109 mg/dL — AB (ref 65–99)
Potassium: 4.1 mmol/L (ref 3.5–5.1)
Sodium: 137 mmol/L (ref 135–145)

## 2015-06-11 LAB — CBC
HCT: 29.5 % — ABNORMAL LOW (ref 36.0–46.0)
Hemoglobin: 9.6 g/dL — ABNORMAL LOW (ref 12.0–15.0)
MCH: 32.7 pg (ref 26.0–34.0)
MCHC: 32.5 g/dL (ref 30.0–36.0)
MCV: 100.3 fL — ABNORMAL HIGH (ref 78.0–100.0)
PLATELETS: 204 10*3/uL (ref 150–400)
RBC: 2.94 MIL/uL — ABNORMAL LOW (ref 3.87–5.11)
RDW: 20.9 % — AB (ref 11.5–15.5)
WBC: 16.2 10*3/uL — ABNORMAL HIGH (ref 4.0–10.5)

## 2015-06-11 LAB — URINE CULTURE: SPECIAL REQUESTS: NORMAL

## 2015-06-11 MED ORDER — FLUCONAZOLE 100 MG PO TABS
100.0000 mg | ORAL_TABLET | Freq: Every day | ORAL | Status: DC
  Administered 2015-06-11: 100 mg via ORAL
  Filled 2015-06-11: qty 1

## 2015-06-11 MED ORDER — BOOST / RESOURCE BREEZE PO LIQD: 1.0000 | Freq: Three times a day (TID) | ORAL | Status: DC

## 2015-06-11 MED ORDER — LIDOCAINE HCL 2 % EX GEL: 1.0000 "application " | CUTANEOUS | Status: DC

## 2015-06-11 MED ORDER — LORAZEPAM 0.5 MG PO TABS: 0.5000 mg | ORAL_TABLET | Freq: Four times a day (QID) | ORAL | Status: DC | PRN

## 2015-06-11 MED ORDER — BOOST / RESOURCE BREEZE PO LIQD
1.0000 | Freq: Three times a day (TID) | ORAL | Status: DC
  Administered 2015-06-11 – 2015-06-14 (×2): 1 via ORAL

## 2015-06-11 MED ORDER — FUROSEMIDE 10 MG/ML IJ SOLN: 20.0000 mg | Freq: Once | INTRAMUSCULAR | Status: DC

## 2015-06-11 MED ORDER — MORPHINE SULFATE (CONCENTRATE) 10 MG/0.5ML PO SOLN
5.0000 mg | ORAL | Status: DC | PRN
  Administered 2015-06-11: 5 mg via ORAL
  Filled 2015-06-11: qty 0.5

## 2015-06-11 MED ORDER — MORPHINE SULFATE 4 MG/ML IJ SOLN: 1.0000 mg | INTRAMUSCULAR | Status: DC | PRN

## 2015-06-11 MED ORDER — MIDODRINE HCL 5 MG PO TABS
5.0000 mg | ORAL_TABLET | Freq: Three times a day (TID) | ORAL | Status: DC
  Administered 2015-06-11 (×2): 5 mg via ORAL
  Filled 2015-06-11 (×6): qty 1

## 2015-06-11 MED ORDER — SODIUM CHLORIDE 0.9 % IV SOLN
8.0000 mg | Freq: Four times a day (QID) | INTRAVENOUS | Status: DC | PRN
  Filled 2015-06-11: qty 4

## 2015-06-11 MED ORDER — LIDOCAINE VISCOUS 2 % MT SOLN: 15.0000 mL | OROMUCOSAL | Status: DC

## 2015-06-11 MED ORDER — SODIUM CHLORIDE 0.9 % IV SOLN
510.0000 mg | Freq: Once | INTRAVENOUS | Status: AC
  Administered 2015-06-12: 510 mg via INTRAVENOUS
  Filled 2015-06-11: qty 17

## 2015-06-11 MED ORDER — LORAZEPAM 2 MG/ML IJ SOLN: 0.5000 mg | INTRAMUSCULAR | Status: DC | PRN

## 2015-06-11 NOTE — Consult Note (Signed)
Consultation Note Date: 06/11/2015   Patient Name: Carly Schwartz  DOB: [birth year]  MRN: [id number]  Age / Sex: 69 y.o., female   PCP: Zada Finders, MD Referring Physician: Janece Canterbury, MD  Reason for Consultation: Establishing goals of care, Non pain symptom management, Pain control and Psychosocial/spiritual support  Palliative Care Assessment and Plan Summary of Established Goals of Care and Medical Treatment Preferences    Palliative Care Discussion Held Today:    This NP Wadie Lessen reviewed medical records, received report from team, assessed the patient and then meet at the patient's bedside along with her SO Kathlyn [place] and granddaughter Arby Barrette  to discuss diagnosis, prognosis, GOC, EOL wishes disposition and options.  Patient was able to explore and verbalize her fears and feelings regarding her current medical situation.  She understands the seriousness of the situation.  It is very difficult for her to make any decisions at this time regarding her medical interventions, this is a very new diagnosis and she and her family are processing  A discussion was had today regarding advanced directives.  Concepts specific to code status was had.  The difference between a aggressive medical intervention path  and a palliative comfort care path for this patient at this time was had.  Values and goals of care important to patient and family were attempted to be elicited.  Concept of Hospice and Palliative Care were discussed  Natural trajectory and expectations at EOL were discussed.  Questions and concerns addressed.   Family encouraged to call with questions or concerns.  PMT will continue to support holistically.     Primary Decision Maker: Self at this time however she tells me today that if she cannot speak for herself she trust Marden Noble and [place] with the decsions   Goals of Care/Code Status/Advance Care Planning:   Code Status:  DNR/DNI-docuemnted today Patient is open to all  offered and available medical interventions to prolong life.  She remains hopeful for improvement and more quality time.  Symptom Management:    Pain: Roxanol 5 mg po/sl every 4 hrs prn   Educated patient on need to request medication as needed and discuss needs with nursing   Psycho-social/Spiritual:   Support System: SO Doug and other family members, she has four children  Desire for further Chaplaincy support: declines at this time   Discharge Planning: Patient is open to short term rehab at [facility], continue with radiation treatment and ultimate gaol is to get back home.       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 Hospitalized in September 2016 for suspected colitis when she was found to have the vulvar mass. Pathology revealed squamous cell carcinoma. PET scan on 06/01/2015 showing primary vulvar carcinoma with metastasis to pelvic lymph nodes, lungs and hilar lymph nodes.  Since being discharged home she had ongoing nausea and poor appetite with weakness. She was referred to the ED from radiation oncology clinic where she was being evaluated for palliative radiation. She was found to have orthostatic hypotension with SBP in the 80s and was admitted. UA was positive for UTI and she was started on antibiotics and XRT.  Palliative radiation to primary vulvar involvement is in process by Dr Sondra Come, planned thru 06-25-15.   Rapid physical and functional decline.  Patient is faced with treatment options and anticipatory care needs  Primary Diagnoses  Present on Admission:  . Hypokalemia . Sepsis ([place]) . UTI (lower urinary tract infection) . Vulvar cancer, carcinoma ([place])  Palliative Review of Systems:   Weakness, poor  appetite/weight loss    I have reviewed the medical record, interviewed the patient and family, and examined the patient. The following aspects are pertinent.  Past Medical History  Diagnosis Date  . Hypertension   . Vulvar cancer    Social History   Social History  . Marital Status: Single    Spouse Name: N/A  . Number of Children: 5  . Years of Education: N/A   Social History Main Topics  . Smoking status: Former Smoker -- 1.00 packs/day for 15 years    Types: Cigarettes    Quit date: 02/16/2015  . Smokeless tobacco: None  . Alcohol Use: No  . Drug Use: No  . Sexual Activity: No   Other Topics Concern  . None   Social History Narrative   Family History  Problem Relation Age of Onset  . Uterine cancer Mother   . Cancer Brother    Scheduled Meds: . dexamethasone  4 mg Intravenous BID  . enoxaparin (LOVENOX) injection  30 mg Subcutaneous Q24H  . feeding supplement  1 Container Oral TID BM  . [START ON 06/12/2015] ferumoxytol  510 mg Intravenous Once  . fluconazole  100 mg Oral Daily  . lidocaine  1 application Topical UD  . midodrine  5 mg Oral TID WC  . ondansetron (ZOFRAN) IV  4 mg Intravenous TID AC   Continuous Infusions:  PRN Meds:.LORazepam, LORazepam, morphine, morphine CONCENTRATE, ondansetron (ZOFRAN) IV, promethazine **OR** promethazine **OR** promethazine Medications Prior to Admission:  Prior to Admission medications   Medication Sig Start Date End Date Taking? Authorizing Provider  mirtazapine (REMERON) 15 MG tablet Take 1 tablet (15 mg total) by mouth at bedtime. 05/28/15  Yes Everitt Amber, MD  feeding supplement (BOOST / RESOURCE BREEZE) LIQD Take 1 Container by mouth 2 (two) times daily between meals. Patient not taking: Reported on 06/02/2015 05/22/15   Dellia Nims, MD  metroNIDAZOLE (FLAGYL) 500 MG tablet Take 1 tablet (500 mg total) by mouth every 6 (six) hours. Patient not taking: Reported on 05/31/2015 05/22/15   Dellia Nims, MD  ondansetron  (ZOFRAN) 4 MG tablet Take 1 tablet (4 mg total) by mouth every 6 (six) hours as needed for nausea. Patient not taking: Reported on 06/02/2015 05/22/15   Dellia Nims, MD  traMADol (ULTRAM) 50 MG tablet Take 1 tablet (50 mg total) by mouth every 6 (six) hours as needed for severe pain. 05/28/15   Dorothyann Gibbs, NP   No Known Allergies CBC:    Component Value Date/Time   WBC 16.2* 06/11/2015 0430   HGB 9.6* 06/11/2015 0430   HCT 29.5* 06/11/2015 0430   PLT 204 06/11/2015 0430   MCV 100.3* 06/11/2015 0430   NEUTROABS 13.3* 06/10/2015 0420   LYMPHSABS 0.6* 06/10/2015 0420   MONOABS 0.7 06/10/2015 0420   EOSABS 1.1* 06/10/2015 0420   BASOSABS 0.0 06/10/2015 0420   Comprehensive Metabolic Panel:    Component Value Date/Time   NA 137 06/11/2015 0430   K 4.1 06/11/2015 0430   CL 110 06/11/2015 0430   CO2 19* 06/11/2015 0430   BUN <5* 06/11/2015 0430   CREATININE 0.57 06/11/2015 0430   GLUCOSE 109* 06/11/2015 0430   CALCIUM 10.0 06/11/2015 0430   AST 10* 06/10/2015  0420   ALT 6* 06/10/2015 0420   ALKPHOS 43 06/10/2015 0420   BILITOT 0.8 06/10/2015 0420   PROT 4.2* 06/10/2015 0420   ALBUMIN 1.8* 06/10/2015 0420    Physical Exam:  Vital Signs: BP 88/54 mmHg  Pulse 91  Temp(Src) 98.8 F (37.1 C) (Oral)  Resp 18  Ht 5\' 2"  (1.575 m)  Wt 48.3 kg (106 lb 7.7 oz)  BMI 19.47 kg/m2  SpO2 92% SpO2: SpO2: 92 % O2 Device: O2 Device: Not Delivered O2 Flow Rate:   Intake/output summary:  Intake/Output Summary (Last 24 hours) at 06/11/15 1519 Last data filed at 06/11/15 1258  Gross per 24 hour  Intake    240 ml  Output    750 ml  Net   -510 ml   LBM: Last BM Date: 06/10/15 Baseline Weight: Weight: 48.3 kg (106 lb 7.7 oz) Most recent weight: Weight: 48.3 kg (106 lb 7.7 oz)  Exam Findings:    General: Cachectic female, ill appearing, NAD  HEENT: dry buccal membranes  Cardiovascular: RRR, warm extremities  Respiratory: Diminished bilateral BS at the bases, no rales,  rhonchi or wheeze, no increased WOB  Abdomen: +BS soft, moderately distended, nontender to palpation  MSK: 1+ edema bilateral LEE  Neuro: Grossly intact          Palliative Performance Scale: 40  %                Additional Data Reviewed: Recent Labs     06/10/15  0420  06/11/15  0430  WBC  15.7*  16.2*  HGB  8.4*  9.6*  PLT  194  204  NA  138  137  BUN  <5*  <5*  CREATININE  0.61  0.57     Time In: 1430 Time Out: 1600 Time Total: 90 min  Greater than 50%  of this time was spent counseling and coordinating care related to the above assessment and plan.  Discussed with  Dr  Sheran Fava  Signed by: Wadie Lessen, NP  Knox Royalty, NP  06/11/2015, 3:19 PM  Please contact Palliative Medicine Team phone at [phone] for questions and concerns.   See AMION for contact information

## 2015-06-11 NOTE — Progress Notes (Signed)
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 Hospitalized in September 2016 for suspected colitis when she was found to have the vulvar mass.  Pathology revealed squamous cell carcinoma.   PET scan on 06/01/2015 showing primary vulvar carcinoma with metastasis to pelvic lymph nodes, lungs and hilar lymph nodes.   Since being discharged home she had ongoing nausea and poor appetite with weakness. She was referred to the ED from radiation oncology clinic where she was being evaluated for palliative radiation.  She was found to have orthostatic hypotension with SBP in the 80s and was admitted.  UA was positive for UTI and she was started on antibiotics and XRT.    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 -  Urine analysis on 05/25/2015 showing presence of bacteria with pyuria -  She remained afebrile, but her WBC trended up and she remained hypotensive -  Repeat UA and urine culture obtained 10/5 and antibiotics increased to zosyn -  Urine culture grew a few colonies of yeast -  All antibiotics discontinued on 10/7  Hypotension, not fluid responsive -  Cortisol level was 19 -  Could be sign of severity of illness -  Will start midodrine  Nausea, vomiting and anorexia, slight improvement at drinking a little.  Still refusing all pills. -  Continue zofran TID  before meals -  Continue phenergan prn -  Started dexamethasone 4mg  IV BID on 10/6  Flat and appears depressed but not willing to take oral medications, get out of bed, or eat or drink much -  Encouraged OOB, trials of food and drink  Hypokalemia resolved with supplementation  Hypomagnesemia, resolved with supplementation  Leukocytosis without fever.  I suspect this is related to her cancer.  CXR without new infiltrate and urine culture grew no bacteria, only a few colonies of yeast.  Anticipate worsening now that she has started dexamethasone.    Iron deficiency anemia with anemia of chronic disease/malignancy -  Agree with iron infusion per [place]  Generalized weakness/deconditioning -Physical therapy has been consulted and recommending SNF  Anasarca with albumin 1.8 -  Hold on lasix since not taking anything PO and hypotensive  Diet:  regular Access:  PIV IVF:  off Proph:  lovenox  Code Status: full code  Family Communication: patient alone Disposition Plan:  Pending [place] conversation.  Will try to titrate up midodrine to adequate blood pressure so that she can transfer to SNF at discharge.     Consultants:  Palliative care  Radiation oncology, Dr. Pablo Ledger  Medical oncology, Dr. Marko Plume  Followed by Dr. Denman George from [place]  Procedures:  None  Antibiotics:  IV Rocephin 9/29--10/5  Start zosyn 10/5 > 10/7   HPI/Subjective:  Nausea improved and starting to drink a few sips of orange juice.  Less swollen.      Objective: Filed Vitals:   06/10/15 2258 06/11/15 0445 06/11/15 0500 06/11/15 1258  BP: 84/56 86/57  88/54  Pulse: 104 96  91  Temp: 98.2 F (36.8 C) 97 F (36.1 C)  98.8 F (37.1 C)  TempSrc: Axillary Axillary  Oral  Resp: 20 16  18   Height:      Weight:      SpO2: 92% 88% 92% 92%    Intake/Output Summary (Last 24 hours) at 06/11/15 1259 Last data filed at 06/11/15 1258  Gross per 24 hour  Intake    240 ml  Output    750 ml  Net    -510 ml   Filed Weights   06/03/15 0320  Weight: 48.3 kg (106 lb 7.7 oz)   Body mass index is 19.47 kg/(m^2).  Exam:   General:  Cachectic female, No acute distress  HEENT:  NCAT, MMM  Cardiovascular:  RRR, nl S1, S2 no mrg, 2+ pulses, warm extremities  Respiratory:  Diminished bilateral BS at the bases, no rales, rhonchi or wheeze, no increased WOB  Abdomen:   NABS, soft, moderately distended, nontender to palpation  MSK:   Normal tone and bulk, 1+ soft pitting bilateral LEE  Neuro:  Grossly intact  Data Reviewed: Basic Metabolic Panel:  Recent Labs Lab 06/05/15 0432 06/06/15 0358 06/08/15 0426 06/10/15 0420 06/11/15 0430  NA 137 134* 138 138 137  K 3.0* 4.6 3.6 3.5 4.1  CL 109 110 112* 113* 110  CO2 21* 18* 17* 19* 19*  GLUCOSE 73 74 71 116* 109*  BUN <5* <5* <5* <5* <5*  CREATININE 0.49 0.55 0.57 0.61 0.57  CALCIUM 8.4* 8.6* 9.3 9.4 10.0   Liver Function Tests:  Recent Labs Lab 06/10/15 0420  AST 10*  ALT 6*  ALKPHOS 43  BILITOT 0.8  PROT 4.2*  ALBUMIN 1.8*   No results for input(s): LIPASE, AMYLASE in the last 168 hours. No results for input(s): AMMONIA in the last 168 hours. CBC:  Recent Labs Lab 06/05/15 0432 06/07/15 0427 06/08/15 0426 06/10/15 0420 06/11/15 0430  WBC 10.4 15.0* 17.2* 15.7* 16.2*  NEUTROABS  --   --   --  13.3*  --   HGB 8.5* 8.3* 9.0* 8.4* 9.6*  HCT 25.6* 27.1* 28.2* 26.8* 29.5*  MCV 95.9 98.2 98.3 100.8* 100.3*  PLT 143* 155 173 194 204    Recent Results (from the past 240 hour(s))  Culture, blood (routine x 2)     Status: None   Collection Time: 06/02/15  5:30 PM  Result Value Ref Range Status   Specimen Description BLOOD RIGHT ANTECUBITAL  Final   Special Requests BOTTLES DRAWN AEROBIC AND ANAEROBIC 5 ML  Final   Culture   Final    NO GROWTH 5 DAYS Performed at [facility]    Report Status 06/07/2015 FINAL  Final  Culture, blood (routine x 2)     Status: None   Collection Time: 06/02/15  6:51 PM   Result Value Ref Range Status   Specimen Description BLOOD RIGHT HAND  Final   Special Requests BOTTLES DRAWN AEROBIC ONLY 8 CC  Final   Culture   Final    NO GROWTH 5 DAYS Performed at [facility]    Report Status 06/07/2015 FINAL  Final  C difficile quick scan w PCR reflex     Status: None   Collection Time: 06/04/15  3:55 PM  Result Value Ref Range Status   C Diff antigen NEGATIVE NEGATIVE Final   C Diff toxin NEGATIVE NEGATIVE Final   C Diff interpretation Negative for toxigenic C. difficile  Final  Urine culture  Status: None   Collection Time: 06/09/15  7:39 PM  Result Value Ref Range Status   Specimen Description URINE, CLEAN CATCH  Final   Special Requests Normal  Final   Culture   Final    60,000 COLONIES/ml YEAST Performed at [facility]    Report Status 06/11/2015 FINAL  Final     Studies: Dg Chest Port 1 View  06/11/2015   CLINICAL DATA:  Hypoxia.  EXAM: PORTABLE CHEST 1 VIEW  COMPARISON:  Chest x-ray 06/02/2015. PET-CT 06/01/2015 . Chest CT 04/20/2015 .  FINDINGS: Persistent mediastinal and hilar fullness consistent with adenopathy. Pulmonary nodules are noted findings suggest metastatic disease. Heart size normal. Diffuse pulmonary interstitial prominence noted. Interstitial prominence could be related interstitial tumor spread or pneumonitis. Small pleural effusions.  IMPRESSION: 1. Mediastinum and hilar fullness consistent adenopathy. 2. Bilateral pulmonary nodules suspicious for metastatic disease. 3. Pulmonary interstitial prominence bilaterally. This could be secondary to interstitial tumor spread and/or pneumonitis. 4. Small bilateral pleural effusions.   Electronically Signed   By: Marcello Moores  Register   On: 06/11/2015 08:44    Scheduled Meds: . dexamethasone  4 mg Intravenous BID  . enoxaparin (LOVENOX) injection  30 mg Subcutaneous Q24H  . feeding supplement  1 Container Oral BID BM  . feeding supplement  1 Container Oral TID BM  . [START ON  06/12/2015] ferumoxytol  510 mg Intravenous Once  . lidocaine  15 mL Mouth/Throat UD  . ondansetron (ZOFRAN) IV  4 mg Intravenous TID AC  . piperacillin-tazobactam (ZOSYN)  IV  3.375 g Intravenous Q8H   Continuous Infusions:    Active Problems:   UTI (lower urinary tract infection)   Hypokalemia   Sepsis ([place])   Vulvar cancer, carcinoma ([facility])   Protein-calorie malnutrition, severe ([place])   Iron deficiency anemia   Palliative care encounter    Time spent: 30 min    SHORT, [place] [facility] Pager [phone]. If 7PM-7AM, please contact night-coverage at [email or web address], password [facility] 06/11/2015, 12:59 PM  LOS: 9 days

## 2015-06-11 NOTE — Progress Notes (Signed)
PT Cancellation Note  Patient Details Name: Carly Schwartz MRN: [id number] DOB: 24-Sep-1945   Cancelled Treatment:    Reason Eval/Treat Not Completed: Medical issues which prohibited therapy (patient declined, states I have a lot  right now. recent Palliative meeting.. RN aware. )   [name] 06/11/2015, 4:51 PM

## 2015-06-11 NOTE — Progress Notes (Signed)
CNA informed patient O2 sats 88% on RA. Patient denies SOB. No acute distress noted. Patient laying in bed. Advised patient to take deep breaths. Recheck O2 sats, increased to 92-93% RA. Educated patient to verbalized if SOB or difficulty breathing occurs. Patient acknowledges and verbalizes understanding. Will continue to monitor.

## 2015-06-11 NOTE — Care Management Important Message (Signed)
Important Message  Patient Details  Name: Carly Schwartz MRN: [id number] Date of Birth: 1946-07-19   Medicare Important Message Given:  Yes-third notification given    Camillo Flaming 06/11/2015, 11:05 AMImportant Message  Patient Details  Name: Carly Schwartz MRN: [id number] Date of Birth: 1946/08/27   Medicare Important Message Given:  Yes-third notification given    Camillo Flaming 06/11/2015, 11:05 AM

## 2015-06-11 NOTE — Progress Notes (Signed)
Unsure of disposition plan at this time. [place] meeting for today at 2:30pm. CM will continue to follow and assist as needed. Marney Doctor RN,BSN,NCM [phone]

## 2015-06-11 NOTE — Progress Notes (Addendum)
CSW continuing to follow.   Pt has SNF bed offers, but has not yet made decision regarding SNF.   Palliative care consulted for [facility].  Per MD, pt will not be ready during the weekend.  CSW to follow up with pt regarding SNF decision.  CSW to continue to follow to provide support and assist with pt disposition planning.  Addendum 3:29 pm:  CSW received notification from PMT NP, Wadie Lessen that goals of care were held with pt and pt significant other. Pt wishes to continue radiation and wants to try rehab at [facility] when medically ready for discharge.   CSW followed up with pt at bedside, but pt significant other had already left the hospital. Pt states that they had not yet made a decision about SNF and pt significant other planned to tour another facility tonight.  Pt agreeable to weekend CSW to follow up to inquire about decision for SNF.   CSW to continue to follow to provide support and assist with pt disposition planning.  Alison Murray, MSW, [place] [facility] [phone]

## 2015-06-11 NOTE — Progress Notes (Signed)
MEDICAL ONCOLOGY June 11, 2015, 12:58 PM  Hospital day 9 Anti-infectives: diflucan Chemotherapy: none  Patient is seen, alone for visit. Discussed with unit RN, who reports that patient refuses po's, has been encouraged to get up to [facility] today, denies pain, does not complain of nausea.  Subjective: Tired, just back from radiation oncology. She denies sore mouth or throat,or any significant difficulty swallowing, tells me that nausea is main hindrance to eating. Dentures do not fit well since weight loss. She does not care for Ensure/ Boost, but likes berry juice type supplements. She admits to significant discomfort in vulvar area, including pain there with transfers on and off RT table and with sitting. She is willing to try sitz baths, discussed voiding into sitz bath full of water to lessen discomfort from urine contact to vulvar area. Discussed topical lidocaine and IV pain medication prn. Discussed diarrhea related to radiation. Discussed generalized weakness related to bedrest x weeks, inadequate nutrition, severe iron deficiency and pain. She denies SOB lying in bed, or cough/ sputum. She did sleep last pm.  Objective: Vital signs in last 24 hours: Blood pressure 86/57, pulse 96, temperature 97 F (36.1 C), temperature source Axillary, resp. rate 16, height 5\' 2"  (1.575 m), weight 106 lb 7.7 oz (48.3 kg), SpO2 92 %.   Intake/Output from previous day: 10/06 0701 - 10/07 0700 In: 0  Out: 550 [Urine:550] Intake/Output this shift:    Physical exam: awake, alert, makes eye contact if requested. Appears very debilitated but not in acute distress, speaks directly and clearly. Not icteric. PERRL. Mucous membranes pale, no thrush or lesions, upper dentures in, mouth moist. No supraclavicular adenopathy.Lungs with slight expiratory wheezes anteriorly. Heart RRR. Abdomen soft, not tender in epigastrium, few BS, no HSM. Large fungating left vulvar mass with serosanguinous drainage onto sheets.  LE 1+ pedal edema without cords or tenderness. Less swelling RUE with IV out on that side, new peripheral IV LUE. Multiple ecchymoses bilateral UE at venous access sites. Skin ok at anterior perineum and mons. Feet warm. Moves all extremities.   Lab Results:  Recent Labs  06/10/15 0420 06/11/15 0430  WBC 15.7* 16.2*  HGB 8.4* 9.6*  HCT 26.8* 29.5*  PLT 194 204   BMET  Recent Labs  06/10/15 0420 06/11/15 0430  NA 138 137  K 3.5 4.1  CL 113* 110  CO2 19* 19*  GLUCOSE 116* 109*  BUN <5* <5*  CREATININE 0.61 0.57  CALCIUM 9.4 10.0    Studies/Results: Dg Chest Port 1 View  06/11/2015   CLINICAL DATA:  Hypoxia.  EXAM: PORTABLE CHEST 1 VIEW  COMPARISON:  Chest x-ray 06/02/2015. PET-CT 06/01/2015 . Chest CT 04/20/2015 .  FINDINGS: Persistent mediastinal and hilar fullness consistent with adenopathy. Pulmonary nodules are noted findings suggest metastatic disease. Heart size normal. Diffuse pulmonary interstitial prominence noted. Interstitial prominence could be related interstitial tumor spread or pneumonitis. Small pleural effusions.  IMPRESSION: 1. Mediastinum and hilar fullness consistent adenopathy. 2. Bilateral pulmonary nodules suspicious for metastatic disease. 3. Pulmonary interstitial prominence bilaterally. This could be secondary to interstitial tumor spread and/or pneumonitis. 4. Small bilateral pleural effusions.   Electronically Signed   By: Marcello Moores  Register   On: 06/11/2015 08:44     DISCUSSION: I have introduced myself again, as she appeared to be asleep during my visit yesterday. I have talked with her about nonaggressive interventions for symptoms as above, which she seems open to trying. I have explained that the vulvar cancer is metastatic and cannot  be cured, tho we hope radiation will improve local discomfort and bleeding at vulva. I have told her that she is not strong enough to consider chemotherapy in attempt to control or improve, but not cure, metastatic  disease; chemo could be reconsidered if she is better nourished and able to tolerate good activity level later; I have told her that in some cases chemotherapy is simply too aggressive for an individual patient to be worth trying.  I have also mentioned possibility of giving IV iron, as this may improve strength together with more effort towards nutrition.  I did not discuss Megace for appetite, tho this might be worthwhile to try (as concentrated liquid). She is aware of plan for palliative care team to visit later today, to help with planning for care after she leaves hospital.   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 Palliative radiation in process. Performance status too poor for chemotherapy to be of benefit.  She has significant nausea and local discomfort/ pain from the advanced vulvar cancer: suggest trying antiemetic before eating now, IV morphine, sitz baths, topical viscous lidocaine. Suggest premedicating radiation with IV morphine and possibly also IV zofran. 2.Protein calorie malnutrition with 35 lb weight loss, taking almost nothing po. Will give IV zofran now then may try mashed potatoes and I have ordered berry supplement drink. Would consider megace for appetite. Pain control might help appetite also.  3.tobacco abuse: DCd with hospitalization. Not on nicotine patch 4.poor peripheral IV access: still peripheral IVs  5.severe iron deficiency anemia: likely from lengthy bleeding from vulvar disease. I suggest IV iron if trying anything more than strict comfort care - order written with comment that primary team to ok prior to administration tomorrow 6.diarrhea: C diff negative. May be from radiation. 7.advance directives to be followed up by palliative  care team   I will follow up at least early next week. Please page me [phone] if I can be of help prior to my next rounds Thank you  Carly Schwartz P MD

## 2015-06-12 DIAGNOSIS — Z7189 Other specified counseling: Secondary | ICD-10-CM

## 2015-06-12 MED ORDER — MIDODRINE HCL 5 MG PO TABS
10.0000 mg | ORAL_TABLET | Freq: Three times a day (TID) | ORAL | Status: DC
  Filled 2015-06-12 (×5): qty 2

## 2015-06-12 MED ORDER — FUROSEMIDE 10 MG/ML IJ SOLN
20.0000 mg | Freq: Once | INTRAMUSCULAR | Status: AC
  Administered 2015-06-12: 20 mg via INTRAVENOUS
  Filled 2015-06-12: qty 2

## 2015-06-12 NOTE — Progress Notes (Signed)
[facility] PROGRESS NOTE  Carly Schwartz WUJ:[id number] DOB: Nov 27, 1945 DOA: 06/02/2015 PCP: Zada Finders, MD  Brief Summary  69 year old female with history of 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 Hospitalized in September 2016 for suspected colitis when she was found to have the vulvar mass.  Pathology revealed squamous cell carcinoma.   PET scan on 06/01/2015 showing primary vulvar carcinoma with metastasis to pelvic lymph nodes, lungs and hilar lymph nodes.   Since being discharged home she had ongoing nausea and poor appetite with weakness. She was referred to the ED from radiation oncology clinic where she was being evaluated for palliative radiation.  She was found to have orthostatic hypotension with SBP in the 80s and was admitted.  UA was positive for UTI and she was started on antibiotics and XRT.    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  Sepsis has resolved. -  Urine analysis on 05/25/2015 showing presence of bacteria with pyuria -  She remained afebrile, but her WBC trended up and she remained hypotensive -  Repeat UA and urine culture obtained 10/5 and antibiotics increased to zosyn -  Urine culture grew a few colonies of yeast -  All antibiotics discontinued on 10/7  Hypotension, not fluid responsive -  Cortisol level was 19 -  Could be sign of severity of illness -  Increase midodrine  Nausea, vomiting and anorexia, slight improvement at drinking a little.  Still refusing all pills and oral  medications -  Continue zofran TID before meals -  Continue phenergan prn -  Started dexamethasone 4mg  IV BID on 10/6  Flat and appears depressed but not willing to take oral medications, get out of bed, or eat or drink much -  Encouraged OOB, trials of food and drink  Hypokalemia resolved with supplementation  Hypomagnesemia, resolved with supplementation  Leukocytosis without fever.  I suspect this is related to her cancer.  CXR without new infiltrate and urine culture grew no bacteria, only a few colonies of yeast.  Anticipate worsening now that she has started dexamethasone.    Iron deficiency anemia with anemia of chronic disease/malignancy -  Agree with iron infusion per [place]  Generalized weakness/deconditioning -Physical therapy has been consulted and recommending SNF but she has declined therapy for several days in a row now and is requesting to use the bedpan so she does not have to get out of bed  Anasarca and acute hypoxic respiratory failure with albumin 1.8 -  Trial of lasix x 1  Diet:  regular Access:  PIV IVF:  off Proph:  lovenox  Code Status:  DNR Family Communication: patient alone Disposition Plan:  SW consult for residential hospice assessment since she has declined therapies and is refusing most PO intake and all oral medications.  Will try to titrate up midodrine to try to diurese her some.    Consultants:  Palliative care  Radiation oncology, Dr. Pablo Ledger  Medical oncology, Dr. Marko Plume  Followed by Dr. Denman George from [facility]  Procedures:  None  Antibiotics:  IV Rocephin 9/29--10/5  Start zosyn 10/5 > 10/7   HPI/Subjective:  Nausea  improved and drinking a few sips of orange juice.  Less swollen.  States that she got to the [facility] yesterday but had bleeding and SOB so she does not want to get out of bed anymore.    Objective: Filed Vitals:   06/11/15 1258 06/11/15 2207 06/12/15 0553 06/12/15 1344  BP: 88/54 90/54 93/60  85/53  Pulse: 91  85 88 88  Temp: 98.8 F (37.1 C) 97.8 F (36.6 C) 97.4 F (36.3 C) 97.6 F (36.4 C)  TempSrc: Oral Axillary Oral Axillary  Resp: 18 16 16 16   Height:      Weight:      SpO2: 92% 90% 91% 98%    Intake/Output Summary (Last 24 hours) at 06/12/15 1621 Last data filed at 06/12/15 1300  Gross per 24 hour  Intake      0 ml  Output    950 ml  Net   -950 ml   Filed Weights   06/03/15 0320  Weight: 48.3 kg (106 lb 7.7 oz)   Body mass index is 19.47 kg/(m^2).  Exam:   General:  Cachectic female, No acute distress  HEENT:  NCAT, MMM  Cardiovascular:  RRR, nl S1, S2 no mrg, 2+ pulses, warm extremities  Respiratory:  Diminished bilateral BS at the bases, faint rales at bases, no rhonchi or wheeze, no increased WOB  Abdomen:   NABS, soft, moderately distended, nontender to palpation  MSK:   Normal tone and bulk, 1+ soft pitting bilateral LEE  Neuro:  Grossly intact  Data Reviewed: Basic Metabolic Panel:  Recent Labs Lab 06/06/15 0358 06/08/15 0426 06/10/15 0420 06/11/15 0430  NA 134* 138 138 137  K 4.6 3.6 3.5 4.1  CL 110 112* 113* 110  CO2 18* 17* 19* 19*  GLUCOSE 74 71 116* 109*  BUN <5* <5* <5* <5*  CREATININE 0.55 0.57 0.61 0.57  CALCIUM 8.6* 9.3 9.4 10.0   Liver Function Tests:  Recent Labs Lab 06/10/15 0420  AST 10*  ALT 6*  ALKPHOS 43  BILITOT 0.8  PROT 4.2*  ALBUMIN 1.8*   No results for input(s): LIPASE, AMYLASE in the last 168 hours. No results for input(s): AMMONIA in the last 168 hours. CBC:  Recent Labs Lab 06/07/15 0427 06/08/15 0426 06/10/15 0420 06/11/15 0430  WBC 15.0* 17.2* 15.7* 16.2*  NEUTROABS  --   --  13.3*  --   HGB 8.3* 9.0* 8.4* 9.6*  HCT 27.1* 28.2* 26.8* 29.5*  MCV 98.2 98.3 100.8* 100.3*  PLT 155 173 194 204    Recent Results (from the past 240 hour(s))  Culture, blood (routine x 2)     Status: None   Collection Time: 06/02/15  5:30 PM  Result Value Ref Range Status   Specimen Description BLOOD RIGHT  ANTECUBITAL  Final   Special Requests BOTTLES DRAWN AEROBIC AND ANAEROBIC 5 ML  Final   Culture   Final    NO GROWTH 5 DAYS Performed at [facility]    Report Status 06/07/2015 FINAL  Final  Culture, blood (routine x 2)     Status: None   Collection Time: 06/02/15  6:51 PM  Result Value Ref Range Status   Specimen Description BLOOD RIGHT HAND  Final   Special Requests BOTTLES DRAWN AEROBIC ONLY 8 CC  Final   Culture   Final    NO GROWTH 5 DAYS Performed at [facility]    Report Status 06/07/2015 FINAL  Final  C difficile quick scan w PCR  reflex     Status: None   Collection Time: 06/04/15  3:55 PM  Result Value Ref Range Status   C Diff antigen NEGATIVE NEGATIVE Final   C Diff toxin NEGATIVE NEGATIVE Final   C Diff interpretation Negative for toxigenic C. difficile  Final  Urine culture     Status: None   Collection Time: 06/09/15  7:39 PM  Result Value Ref Range Status   Specimen Description URINE, CLEAN CATCH  Final   Special Requests Normal  Final   Culture   Final    60,000 COLONIES/ml YEAST Performed at [facility]    Report Status 06/11/2015 FINAL  Final     Studies: Dg Chest Port 1 View  06/11/2015   CLINICAL DATA:  Hypoxia.  EXAM: PORTABLE CHEST 1 VIEW  COMPARISON:  Chest x-ray 06/02/2015. PET-CT 06/01/2015 . Chest CT 04/20/2015 .  FINDINGS: Persistent mediastinal and hilar fullness consistent with adenopathy. Pulmonary nodules are noted findings suggest metastatic disease. Heart size normal. Diffuse pulmonary interstitial prominence noted. Interstitial prominence could be related interstitial tumor spread or pneumonitis. Small pleural effusions.  IMPRESSION: 1. Mediastinum and hilar fullness consistent adenopathy. 2. Bilateral pulmonary nodules suspicious for metastatic disease. 3. Pulmonary interstitial prominence bilaterally. This could be secondary to interstitial tumor spread and/or pneumonitis. 4. Small bilateral pleural effusions.    Electronically Signed   By: Marcello Moores  Register   On: 06/11/2015 08:44    Scheduled Meds: . dexamethasone  4 mg Intravenous BID  . enoxaparin (LOVENOX) injection  30 mg Subcutaneous Q24H  . feeding supplement  1 Container Oral TID BM  . fluconazole  100 mg Oral Daily  . lidocaine  1 application Topical UD  . midodrine  10 mg Oral TID WC  . ondansetron (ZOFRAN) IV  4 mg Intravenous TID AC   Continuous Infusions:    Active Problems:   UTI (lower urinary tract infection)   Hypokalemia   Sepsis ([place])   Vulvar cancer, carcinoma (HCC)   Protein-calorie malnutrition, severe ([place])   Iron deficiency anemia   Palliative care encounter   Arterial hypotension   DNR (do not resuscitate) discussion   Pain, cancer    Time spent: 30 min    Jayleon Mcfarlane, [place] [facility] Pager [phone]. If 7PM-7AM, please contact night-coverage at [email or web address], password [facility] 06/12/2015, 4:21 PM  LOS: 10 days

## 2015-06-13 DIAGNOSIS — I950 Idiopathic hypotension: Secondary | ICD-10-CM

## 2015-06-13 MED ORDER — MORPHINE SULFATE (CONCENTRATE) 10 MG/0.5ML PO SOLN
5.0000 mg | Freq: Two times a day (BID) | ORAL | Status: DC
  Administered 2015-06-13 – 2015-06-14 (×3): 5 mg via ORAL
  Filled 2015-06-13 (×3): qty 0.5

## 2015-06-13 MED ORDER — MIDODRINE HCL 5 MG PO TABS
15.0000 mg | ORAL_TABLET | Freq: Three times a day (TID) | ORAL | Status: DC
  Filled 2015-06-13 (×5): qty 3

## 2015-06-13 NOTE — Progress Notes (Signed)
CSW received consult for potential residential hospice placement.  CSW met with pt and pt son at bedside to discuss further- pt reports that she is currently considering residential hospice vs SNF facility but is undecided at this time.  Pt states she will be meeting with palliative later and will discuss further with her family prior to making a decision.   Pt has no questions for CSW at this time- CSW will continue to follow for SNF vs hospice placement  Domenica Reamer, [place] Social Worker [phone]

## 2015-06-13 NOTE — Progress Notes (Signed)
[facility] PROGRESS NOTE  Carly Schwartz DDU:[id number] DOB: July 21, 1946 DOA: 06/02/2015 PCP: Zada Finders, MD  Brief Summary  69 year old female with history of 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 Hospitalized in September 2016 for suspected colitis when she was found to have the vulvar mass.  Pathology revealed squamous cell carcinoma.   PET scan on 06/01/2015 showing primary vulvar carcinoma with metastasis to pelvic lymph nodes, lungs and hilar lymph nodes.   Since being discharged home she had ongoing nausea and poor appetite with weakness. She was referred to the ED from radiation oncology clinic where she was being evaluated for palliative radiation.  She was found to have orthostatic hypotension with SBP in the 80s and was admitted.  UA was positive for UTI and she was started on antibiotics and XRT.    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  Sepsis has resolved. -  Urine analysis on 05/25/2015 showing presence of bacteria with pyuria -  She remained afebrile, but her WBC trended up and she remained hypotensive -  Repeat UA and urine culture obtained 10/5 and antibiotics increased to zosyn -  Urine culture grew a few colonies of yeast -  All antibiotics discontinued on 10/7  Hypotension, not fluid responsive -  Cortisol level was 19 -  Could be sign of severity of illness -  Increase midodrine to 15 mg TID  Nausea, vomiting and anorexia, slight  improvement at drinking a little.  Still refusing all pills and oral medications -  Continue zofran TID before meals -  Continue phenergan prn -  Started dexamethasone 4mg  IV BID on 10/6  Flat and appears depressed but not willing to take oral medications, get out of bed, or eat or drink much -  Encouraged OOB, trials of food and drink  Hypokalemia resolved with supplementation  Hypomagnesemia, resolved with supplementation  Leukocytosis without fever.  I suspect this is related to her cancer.  CXR without new infiltrate and urine culture grew no bacteria, only a few colonies of yeast.  Anticipate worsening now that she has started dexamethasone.    Iron deficiency anemia with anemia of chronic disease/malignancy -  Agree with iron infusion per [place]  Generalized weakness/deconditioning -Physical therapy has been consulted and recommending SNF but she has declined therapy for several days in a row now and is requesting to use the bedpan so she does not have to get out of bed  Anasarca and acute hypoxic respiratory failure with albumin 1.8.  Likely has pulmonary edema from low albumin.   - hold lasix today due to hypotension - if BP better tomorrow, consider additional dose  Diet:  regular Access:  PIV IVF:  off Proph:  lovenox  Code Status:  DNR Family Communication: patient alone Disposition Plan:  Will have her evaluated by residential hospice, otherwise to SNF with hospice care.    Consultants:  Palliative care  Radiation oncology, Dr. Pablo Ledger  Medical oncology, Dr. Marko Plume  Followed by Dr. Denman George from [place]  Procedures:  None  Antibiotics:  IV Rocephin 9/29--10/5  Start zosyn 10/5 > 10/7   HPI/Subjective:  Nausea persistent but drinking a few sips of orange juice.  Has had some vulvar pain.  Does not want to do any more physical therapy.  Ready for hospice care.  Breathing is better after some diuresis yesterday.    Objective: Filed Vitals:    06/12/15 1344 06/12/15 2028 06/13/15 0437 06/13/15 0609  BP: 85/53 93/58 88/55    Pulse: 88 92 79   Temp: 97.6 F (36.4 C) 97.9 F (36.6 C) 94.3 F (34.6 C) 98.4 F (36.9 C)  TempSrc: Axillary Axillary    Resp: 16 16 16    Height:      Weight:      SpO2: 98% 94% 98%     Intake/Output Summary (Last 24 hours) at 06/13/15 1433 Last data filed at 06/13/15 0627  Gross per 24 hour  Intake      0 ml  Output    200 ml  Net   -200 ml   Filed Weights   06/03/15 0320  Weight: 48.3 kg (106 lb 7.7 oz)   Body mass index is 19.47 kg/(m^2).  Exam:   General:  Cachectic female, No acute distress  HEENT:  NCAT, MMM  Cardiovascular:  RRR, nl S1, S2 no mrg, 2+ pulses, warm extremities  Respiratory:  Diminished bilateral BS at the bases, faint rales at bases, no rhonchi or wheeze, no increased WOB  Abdomen:   NABS, soft, moderately distended, nontender to palpation  MSK:   Normal tone and bulk, 1+ soft pitting bilateral LEE  Neuro:  Diffusely weak  Psych:  Flat affect  GU:  Large fungating mass on vulvar area without hemorrhage.  No obvious radiation dermatitis or candidal infection  Data Reviewed: Basic Metabolic Panel:  Recent Labs Lab 06/08/15 0426 06/10/15 0420 06/11/15 0430  NA 138 138 137  K 3.6 3.5 4.1  CL 112* 113* 110  CO2 17* 19* 19*  GLUCOSE 71 116* 109*  BUN <5* <5* <5*  CREATININE 0.57 0.61 0.57  CALCIUM 9.3 9.4 10.0   Liver Function Tests:  Recent Labs Lab 06/10/15 0420  AST 10*  ALT 6*  ALKPHOS 43  BILITOT 0.8  PROT 4.2*  ALBUMIN 1.8*   No results for input(s): LIPASE, AMYLASE in the last 168 hours. No results for input(s): AMMONIA in the last 168 hours. CBC:  Recent Labs Lab 06/07/15 0427 06/08/15 0426 06/10/15 0420 06/11/15 0430  WBC 15.0* 17.2* 15.7* 16.2*  NEUTROABS  --   --  13.3*  --   HGB 8.3* 9.0* 8.4* 9.6*  HCT 27.1* 28.2* 26.8* 29.5*  MCV 98.2 98.3 100.8* 100.3*  PLT 155 173 194 204    Recent Results (from the past 240  hour(s))  C difficile quick scan w PCR reflex     Status: None   Collection Time: 06/04/15  3:55 PM  Result Value Ref Range Status   C Diff antigen NEGATIVE NEGATIVE Final   C Diff toxin NEGATIVE NEGATIVE Final   C Diff interpretation Negative for toxigenic C. difficile  Final  Urine culture     Status: None   Collection Time: 06/09/15  7:39 PM  Result Value Ref Range Status   Specimen Description URINE, CLEAN CATCH  Final   Special Requests Normal  Final   Culture   Final    60,000 COLONIES/ml YEAST Performed at [facility]    Report Status 06/11/2015 FINAL  Final     Studies: No results found.  Scheduled  Meds: . dexamethasone  4 mg Intravenous BID  . enoxaparin (LOVENOX) injection  30 mg Subcutaneous Q24H  . feeding supplement  1 Container Oral TID BM  . fluconazole  100 mg Oral Daily  . lidocaine  1 application Topical UD  . midodrine  10 mg Oral TID WC  . ondansetron (ZOFRAN) IV  4 mg Intravenous TID AC   Continuous Infusions:    Active Problems:   UTI (lower urinary tract infection)   Hypokalemia   Sepsis ([place])   Vulvar cancer, carcinoma (HCC)   Protein-calorie malnutrition, severe ([place])   Iron deficiency anemia   Palliative care encounter   Arterial hypotension   DNR (do not resuscitate) discussion   Pain, cancer    Time spent: 30 min    Elray Dains, [place] [facility] Pager [phone]. If 7PM-7AM, please contact night-coverage at [email or web address], password [facility] 06/13/2015, 2:33 PM  LOS: 11 days

## 2015-06-13 NOTE — Progress Notes (Signed)
Checked vital signs this morning and NT reported an issue with pts temp. RN and NT tried both oral and axillary temps several times with 2 different thermometers, and obtained 2 readings of 94.3. Unable to check rectal temp due to vulva mass obstructing rectal area. Pt reports she feels okay, "a little chilly". Skin has some warmth to the touch but cooler than normal. Paged on call NP and advised to pack pt in warm blankets for 1 hour and recheck temperature. Will determine what to do next based on those results. Remainder of vital signs at patient's baseline. Continue to monitor. Hortencia Conradi RN

## 2015-06-14 ENCOUNTER — Ambulatory Visit
Admit: 2015-06-14 | Discharge: 2015-06-14 | Disposition: A | Discharge status: Home / Self Care | Payer: Medicare Other | Attending: Radiation Oncology | Admitting: Radiation Oncology

## 2015-06-14 MED ORDER — MIDODRINE HCL 5 MG PO TABS: 15.0000 mg | ORAL_TABLET | Freq: Three times a day (TID) | ORAL | Status: AC

## 2015-06-14 MED ORDER — LORAZEPAM 0.5 MG PO TABS: 0.5000 mg | ORAL_TABLET | Freq: Four times a day (QID) | ORAL | Status: AC | PRN

## 2015-06-14 MED ORDER — MORPHINE SULFATE (CONCENTRATE) 10 MG/0.5ML PO SOLN: 5.0000 mg | ORAL | Status: AC | PRN

## 2015-06-14 MED ORDER — PROMETHAZINE HCL 12.5 MG PO TABS: 12.5000 mg | ORAL_TABLET | Freq: Four times a day (QID) | ORAL | Status: DC | PRN

## 2015-06-14 MED ORDER — LIDOCAINE HCL 2 % EX GEL: 1.0000 "application " | CUTANEOUS | Status: AC

## 2015-06-14 MED ORDER — DEXAMETHASONE 4 MG PO TABS: 4.0000 mg | ORAL_TABLET | Freq: Two times a day (BID) | ORAL | Status: AC

## 2015-06-14 MED ORDER — FLUCONAZOLE 100 MG PO TABS: 100.0000 mg | ORAL_TABLET | Freq: Every day | ORAL | Status: AC

## 2015-06-14 MED ORDER — ONDANSETRON 4 MG PO TBDP: 4.0000 mg | ORAL_TABLET | Freq: Three times a day (TID) | ORAL | Status: AC

## 2015-06-14 NOTE — Progress Notes (Signed)
Nutrition Follow-up  DOCUMENTATION CODES:   Severe malnutrition in context of chronic illness  INTERVENTION:   Continue Boost Breeze po TID, each supplement provides 250 kcal and 9 grams of protein Continue to encourage PO intake RD to continue to monitor for plan  NUTRITION DIAGNOSIS:   Malnutrition related to chronic illness as evidenced by severe depletion of muscle mass, percent weight loss.  Ongoing.  GOAL:   Patient will meet greater than or equal to 90% of their needs  Not meeting. Refusing meals.  MONITOR:   PO intake, Supplement acceptance, Labs, Weight trends, Skin, I & O's  REASON FOR ASSESSMENT:   Malnutrition Screening Tool    ASSESSMENT:   Carly Schwartz is a 69 year old woman with a PMH of recently diagnosed Vulvar SCC, malnutrition, and persistent hypotension who presents with light-headedness and dizziness. This episode was noted at the cancer center where she was receiving PET/CT scan to query lung nodules, which have been found to be metastases. She said that she has been unable to stand for the past 2 weeks due to her "legs feeling like spaghetti." She particularly feels dizzy when she gets up, but she is unable to sit for long due to pain from her vulvar cancer. She also reports a poor appetite over the last two weeks, saying that "food just doesn't taste the same." She says she'll drink water, but often times will go the entire day without eating. For anorexia and sleep disturbance, mirtazapine was started on 9/23 by her gynecological oncologist, but it has not yet been effective. She reports a 30 pound weight loss over the past four months. She reports feelings of depression, especially since her recent cancer diagnosis, but is hopeful and prays every day. She has no suicidal ideation.  Pt continues to refuse meals and medications, pt sips on juice. GOC continue to be discussed. Pt to discharge on hospice care.  Labs reviewed: Low BUN  Diet Order:  Diet  regular Room service appropriate?: Yes; Fluid consistency:: Thin  Skin:  Reviewed, no issues  Last BM:  10/7  Height:   Ht Readings from Last 1 Encounters:  06/03/15 5\' 2"  (1.575 m)    Weight:   Wt Readings from Last 1 Encounters:  06/03/15 106 lb 7.7 oz (48.3 kg)    Ideal Body Weight:  50 kg  BMI:  Body mass index is 19.47 kg/(m^2).  Estimated Nutritional Needs:   Kcal:  1500-1700  Protein:  70-80 grams  Fluid:  1.5-1.7 L  EDUCATION NEEDS:   No education needs identified at this time  Clayton Bibles, MS, RD, LDN Pager: [phone] After Hours Pager: [phone]

## 2015-06-14 NOTE — Progress Notes (Signed)
Patient d/c with transporters

## 2015-06-14 NOTE — Discharge Summary (Addendum)
Physician Discharge Summary  Carly Schwartz:[id number] DOB: [birth year] DOA: 06/02/2015  PCP: Zada Finders, MD  Admit date: 06/02/2015 Discharge date: 06/14/2015  Recommendations for Outpatient Follow-up:  1. Continue radiation treatments through 10/21 2. F/u with Dr. Marko Plume in 1-2 weeks to discuss progress.  Repeat labs such as CBC at discretion of oncology. 3. Palliative care consult:  Please notify as soon as possible 4. PT/OT and encourage PO with supplements 5. Continue fluconazole through 10/21, then stop  Discharge Diagnoses:  Active Problems:   UTI (lower urinary tract infection)   Hypokalemia   Vulvar cancer, carcinoma (HCC)   Protein-calorie malnutrition, severe (HCC)   Iron deficiency anemia   Palliative care encounter   Arterial hypotension   DNR (do not resuscitate) discussion   Pain, cancer   Discharge Condition: fair  Diet recommendation: regular   Wt Readings from Last 3 Encounters:  06/03/15 48.3 kg (106 lb 7.7 oz)  06/02/15 42.321 kg (93 lb 4.8 oz)  05/31/15 45.904 kg (101 lb 3.2 oz)    History of present illness:  69 year old female with history of 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 Hospitalized in September 2016 for suspected colitis when she was found to have the vulvar mass. Pathology revealed squamous cell carcinoma. PET scan on 06/01/2015 showing primary vulvar carcinoma with metastasis to pelvic lymph nodes, lungs and hilar lymph nodes.  Since being discharged home she had ongoing nausea and poor appetite with weakness. She was referred to the ED from radiation oncology clinic where she was being evaluated for palliative radiation. She was found to have orthostatic hypotension with SBP in the 80s and was admitted. UA was positive for UTI and she was started on antibiotics and  XRT.  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  She was seen by oncology who felt that given her weakness and inability to eat that she would not be a good candidate for chemotherapy.  She was started on palliative radiation by radiation oncology on 10/3 and will continue treatments through 10/21, then stop.  She met with palliative care and clarified her goals of care, which are to increase her strength and nutrition if possible.  If she continues to decline, she will like to transition to hospice care/residential hospice.  Palliative care to please follow at SNF.  For pain, she was started on morphine with stool softeners.    We initially thought she may have sepsis secondary to UTI since she had bacteria and pyuria on her UA.  She also had leukocytosis, tachycardia, hypotension and generalized weakness. She was started on empiric antibiotics, however, she continued to have tachycardia, hypotension, and leukocytosis.  Her urine cultures grew a few colonies of yeast, otherwise, they were not suggestive of UTI.  Her antibiotics were discontinued on 10/7.    Hypotension, not fluid responsive.  Cortisol level was 19.  She was started on dexamethasone for nausea which had no effect on  Her blood pressure.  She was started on midodrine but remained hypotensive.  I believe that her hypotension is related to her malignancy.    Nausea, vomiting and severe protein calorie malnutrition, slight improvement at drinking a little.  She was transitioned to sublingual zofran and phenergan as needed.  She should also take dexamethasone 17m twice a day.  She met  with the nutritionist and should be allowed a regular diet and supplements.    Flat and appears depressed but not willing to take oral medications, get out of bed, or eat or drink much.  Encouraged OOB, trials of food and drink.    Hypokalemia resolved with supplementation  Hypomagnesemia,  resolved with supplementation  Leukocytosis without fever. I suspect this is related to her cancer. CXR without new infiltrate and urine culture grew no bacteria, only a few colonies of yeast. Anticipate worsening now that she has started dexamethasone.   Iron deficiency anemia with anemia of chronic disease/malignancy.  She was given an infusion of feraheme.   Generalized weakness/deconditioning.  PT/OT recommended SNF.    Anasarca and acute hypoxic respiratory failure with albumin 1.8. Likely has pulmonary edema from low albumin. She was given intermittent doses of lasix which improved her SOB but could not be continued due to hypotension.    Possible candidal UTI with urinary frequency.  She did not have a urinary catheter.  She was started on a two week course of fluconazole.    Consultants:  Palliative care  Radiation oncology, Dr. Pablo Ledger  Medical oncology, Dr. Marko Plume  Followed by Dr. Denman George from [place]  Procedures:  None  Antibiotics:  IV Rocephin 9/29--10/5  Start zosyn 10/5 > 10/7  Discharge Exam: Filed Vitals:   06/14/15 1345  BP: 90/51  Pulse: 72  Temp: 97.5 F (36.4 C)  Resp: 18   Filed Vitals:   06/13/15 1430 06/13/15 2043 06/14/15 0452 06/14/15 1345  BP: 91/55 90/53 96/55  90/51  Pulse: 89 85 76 72  Temp: 97.3 F (36.3 C) 98.1 F (36.7 C) 97.3 F (36.3 C) 97.5 F (36.4 C)  TempSrc: Axillary Axillary Axillary Oral  Resp: 16 16 16 18   Height:      Weight:      SpO2: 98% 95% 95% 96%     General: Cachectic female, No acute distress  HEENT: NCAT, MMM  Cardiovascular: RRR, nl S1, S2 no mrg, 2+ pulses, warm extremities  Respiratory: Diminished bilateral BS at the bases, faint rales at bases, no rhonchi or wheeze, no increased WOB  Abdomen: NABS, soft, moderately distended, nontender to palpation  MSK: Normal tone and bulk, 1+ soft pitting bilateral LEE  Neuro: Diffusely weak  Psych: Flat affect  GU: Large fungating  mass on vulvar area without hemorrhage. No obvious radiation dermatitis or candidal infection, similar to yesterday.  Discharge Instructions      Discharge Instructions    Call MD for:  difficulty breathing, headache or visual disturbances    Complete by:  As directed      Call MD for:  extreme fatigue    Complete by:  As directed      Call MD for:  hives    Complete by:  As directed      Call MD for:  persistant dizziness or light-headedness    Complete by:  As directed      Call MD for:  persistant nausea and vomiting    Complete by:  As directed      Call MD for:  severe uncontrolled pain    Complete by:  As directed      Call MD for:  temperature >100.4    Complete by:  As directed      Diet general    Complete by:  As directed      Increase activity slowly    Complete by:  As directed  Medication List    STOP taking these medications        metroNIDAZOLE 500 MG tablet  Commonly known as:  FLAGYL     ondansetron 4 MG tablet  Commonly known as:  ZOFRAN     traMADol 50 MG tablet  Commonly known as:  ULTRAM      TAKE these medications        dexamethasone 4 MG tablet  Commonly known as:  DECADRON  Take 1 tablet (4 mg total) by mouth 2 (two) times daily with a meal.     feeding supplement Liqd  Take 1 Container by mouth 2 (two) times daily between meals.     fluconazole 100 MG tablet  Commonly known as:  DIFLUCAN  Take 1 tablet (100 mg total) by mouth daily.     lidocaine 2 % jelly  Commonly known as:  XYLOCAINE  Apply 1 application topically as directed. Apply to perineum for pain up to 3 times per day.  Do NOT use four hours prior to radiation.     LORazepam 0.5 MG tablet  Commonly known as:  ATIVAN  Take 1 tablet (0.5 mg total) by mouth every 6 (six) hours as needed for anxiety.     midodrine 5 MG tablet  Commonly known as:  PROAMATINE  Take 3 tablets (15 mg total) by mouth 3 (three) times daily with meals.     mirtazapine 15 MG  tablet  Commonly known as:  REMERON  Take 1 tablet (15 mg total) by mouth at bedtime.     morphine CONCENTRATE 10 MG/0.5ML Soln concentrated solution  Take 0.25 mLs (5 mg total) by mouth every 2 (two) hours as needed for moderate pain, severe pain or shortness of breath.     ondansetron 4 MG disintegrating tablet  Commonly known as:  ZOFRAN ODT  Take 1 tablet (4 mg total) by mouth 3 (three) times daily before meals.     promethazine 12.5 MG tablet  Commonly known as:  PHENERGAN  Take 1 tablet (12.5 mg total) by mouth every 6 (six) hours as needed for nausea or vomiting.       Follow-up Information    Follow up with Zada Finders, MD.   Specialty:  Internal Medicine   Contact information:   [place] [phone]       Follow up with LIVESAY,LENNIS P, MD. Schedule an appointment as soon as possible for a visit in 2 weeks.   Specialty:  Oncology   Contact information:   [address] [place] [address] [phone]       Follow up with [facility].   Specialty:  Radiation Oncology   Why:  daily   Contact information:   [place] [id number] [place] [phone]       The results of significant diagnostics from this hospitalization (including imaging, microbiology, ancillary and laboratory) are listed below for reference.    Significant Diagnostic Studies: Dg Chest 2 View  06/02/2015   CLINICAL DATA:  Weakness and hypotension today.  EXAM: CHEST  2 VIEW  COMPARISON:  Chest radiograph 05/19/2015, PET-CT yesterday.  FINDINGS: Cardiomediastinal contours are unchanged. Small pleural effusions are seen, similar to CT performed yesterday. Multiple left-sided pulmonary nodules again demonstrated. The right-sided pulmonary nodules on PET-CT are not well seen. No pulmonary edema, consolidation, or pneumothorax. No acute osseous abnormalities are seen.  IMPRESSION: 1. Small  bilateral pleural effusions. 2.  Multiple pulmonary nodules consistent with metastatic disease.   Electronically Signed   By: Jeb Levering M.D.   On: 06/02/2015 18:48   Ct Abdomen Pelvis W Contrast  05/18/2015   CLINICAL DATA:  Hematuria. Patient states ongoing UTI 3 months. Feeling weaker and dizzy today.  EXAM: CT ABDOMEN AND PELVIS WITH CONTRAST  TECHNIQUE: Multidetector CT imaging of the abdomen and pelvis was performed using the standard protocol following bolus administration of intravenous contrast.  CONTRAST:  74m OMNIPAQUE IOHEXOL 300 MG/ML SOLN, 1040mOMNIPAQUE IOHEXOL 300 MG/ML SOLN  COMPARISON:  Chest CT 04/20/2015  FINDINGS: Atelectasis/scarring over the posterior right lower lobe. Moderate size hiatal hernia unchanged.  Abdominal images demonstrate the liver, spleen, pancreas, gallbladder and adrenal glands to be within normal. Kidneys are normal in size without hydronephrosis or nephrolithiasis. There is a sub cm right renal cortical hypodensity too small to characterize but likely a cyst. Ureters are within normal. Appendix is normal.  There is minimal calcified plaque over the abdominal aorta. Small lipoma over the second portion of the duodenum.  There is mild diverticulosis of the sigmoid colon. There is wall thickening of the colon from the splenic flexure to the junction of the sigmoid colon in the left lower quadrant compatible with a mild acute colitis. No evidence of perforation. No adjacent free fluid. Mild mucosal enhancement of several small bowel loops in the pelvis which are nondilated. There is mild engorgement of the adjacent vasa recta as findings may be due to regional enteritis. The small bowel findings as well as the colonic findings together could be seen in inflammatory bowel disease such as Crohn's disease.  Pelvic images demonstrate the bladder, uterus, ovaries and rectum to be within normal. There is a heterogeneous abnormal left inguinal lymph node measuring 3.4 cm in  diameter. There is a smaller adjacent low-density lymph node in the left inguinal region. There is a low-density rim enhancing with 2.1 cm left external iliac lymph node. This may be due to metastatic disease in this patient with bilateral pulmonary nodules and pleural effusions. Mild degenerate change of the spine and hips.  IMPRESSION: Colonic wall thickening from the splenic flexure to the junction of the descending colon to sigmoid colon likely representing an acute colitis of infectious or inflammatory nature. Mild mucosal enhancement of several small bowel loops in the pelvis with engorgement of the Vasa recta as findings may be due to a regional enteritis although together with the colonic findings could be seen in inflammatory bowel disease due to Crohn's.  Abnormal left external iliac and inguinal adenopathy suggesting metastatic disease. Recommend clinical correlation and PET scan versus tissue diagnosis via biopsy of this left inguinal node.  Moderate size hiatal hernia unchanged.   Electronically Signed   By: DaMarin Olp.D.   On: 05/18/2015 22:25   Nm Pet Image Initial (pi) Skull Base To Thigh  06/01/2015   CLINICAL DATA:  Initial treatment strategy for vulvar squamous cell carcinoma.  EXAM: NUCLEAR MEDICINE PET SKULL BASE TO THIGH  TECHNIQUE: 6.4 mCi F-18 FDG was injected intravenously. Full-ring PET imaging was performed from the skull base to thigh after the radiotracer. CT data was obtained and used for attenuation correction and anatomic localization.  FASTING BLOOD GLUCOSE:  Value: 81 mg/dl  COMPARISON:  AP CT on 05/18/2015 and chest CT on 04/20/2015  FINDINGS: NECK  No hypermetabolic lymph nodes in the neck.  CHEST  Hypermetabolic lymphadenopathy is seen in the mediastinum and bilateral hilar regions. Index mediastinal lymph  node and right paratracheal region measures 1.6 cm on image 53/series 4 compared to 7 mm previously. This has a maximum SUV of 13.6.  Hypermetabolic pulmonary nodules  are seen bilaterally which are increased in size and number compared to previous study. These are consistent with pulmonary metastases. Index nodule in the superior segment of the left lower lobe measures 16 mm on image 21 of series 8 compared to 9 mm previously, and has an SUV max of 11.5.  Small bilateral pleural effusions are again seen without associated hypermetabolic activity. Small hiatal hernia again noted.  ABDOMEN/PELVIS  No abnormal hypermetabolic activity within the liver, pancreas, adrenal glands, or spleen. No hypermetabolic lymph nodes in the abdomen.  A large lobulated soft tissue mass is seen involving the vulva which was incompletely visualized on previous CT. This measures approximately 5.5 x 9.2 cm on image 178 of series 4, and has an SUV max of 49.9. This is consistent with primary volar carcinoma.  Hypermetabolic lymphadenopathy is seen in the left external iliac chain, with largest lymph node measuring 2.2 cm on image 151 of series 4, with SUV max of 22.0. Hypermetabolic left inguinal lymphadenopathy is also seen measuring 2.8 cm on image 160 of series 4, which has SUV max of 31.9. This lymphadenopathy shows no significant change in size compared to most recent CT on 05/18/2015.  SKELETON  No focal hypermetabolic activity to suggest skeletal metastasis.  IMPRESSION: Large hypermetabolic vulvar soft tissue mass, consistent with primary vulvar carcinoma.  Hypermetabolic pelvic lymphadenopathy in left external iliac chain and left inguinal region, consistent with metastatic disease.  Hypermetabolic bilateral pulmonary metastases, increased compared to recent chest CT. Small bilateral pleural effusions also noted.  Increased hypermetabolic mediastinal and bilateral hilar lymphadenopathy, consistent with metastatic disease.   Electronically Signed   By: Earle Gell M.D.   On: 06/01/2015 15:20   [place] Misc Soft Tissue  05/19/2015   CLINICAL DATA:  Vulvar mass with LEFT inguinal adenopathy by CT   EXAM: SOFT TISSUE ULTRASOUND - MISCELLANEOUS  TECHNIQUE: Ultrasound examination of the pelvic soft tissues was performed in the area of clinical concern at the LEFT inguinal region and vulva.  COMPARISON:  CT abdomen and pelvis 05/18/2015  FINDINGS: Large heterogeneous predominately hypoechoic involve are soft tissue mass identified measuring 9.2 x 8.4 x 5.1 cm.  Mass extends towards the vagina but full extent is poorly visualized due to sound attenuation and size of lesion.  Few small microcalcifications are seen within the mass.  Mass demonstrates significant internal vascularity on color Doppler imaging.  Small adjacent hypervascular mass identified 2.7 x 1.7 x 2.3 cm in size which could represent a additional tumor nodule or a metastatic lymph node.  Preceding CT exam showed heterogeneously enhancing abnormal LEFT inguinal and external iliac adenopathy.  Prior CT however did not demonstrate full extent of this tumor, did not extend far enough inferiorly.  IMPRESSION: Large vulvar mass highly suspicious for neoplasm, 9.2 x 8.4 x 5.1 cm in size, hypervascular and containing a few microcalcifications.  Additional subcutaneous nodule is seen which may represent a additional tumor or metastatic lymph node 2.7 x 1.7 x 2.3 cm in size.  Patient had LEFT inguinal and external iliac adenopathy by preceding CT.  Since the origin and deep extent of this lesion are not well visualized, recommend MR imaging with and without contrast to further characterize.   Electronically Signed   By: Lavonia Dana M.D.   On: 05/19/2015 17:20   Dg Chest Port 1  View  06/11/2015   CLINICAL DATA:  Hypoxia.  EXAM: PORTABLE CHEST 1 VIEW  COMPARISON:  Chest x-ray 06/02/2015. PET-CT 06/01/2015 . Chest CT 04/20/2015 .  FINDINGS: Persistent mediastinal and hilar fullness consistent with adenopathy. Pulmonary nodules are noted findings suggest metastatic disease. Heart size normal. Diffuse pulmonary interstitial prominence noted. Interstitial  prominence could be related interstitial tumor spread or pneumonitis. Small pleural effusions.  IMPRESSION: 1. Mediastinum and hilar fullness consistent adenopathy. 2. Bilateral pulmonary nodules suspicious for metastatic disease. 3. Pulmonary interstitial prominence bilaterally. This could be secondary to interstitial tumor spread and/or pneumonitis. 4. Small bilateral pleural effusions.   Electronically Signed   By: Marcello Moores  Register   On: 06/11/2015 08:44   Dg Chest Port 1 View  05/19/2015   CLINICAL DATA:  Sepsis.  EXAM: PORTABLE CHEST - 1 VIEW  COMPARISON:  Chest CT 04/20/2015  FINDINGS: Left upper lobe pulmonary nodule is less well-defined than on prior exam. Additional pulmonary nodules on prior CT are not definitively seen. Heart size and mediastinal contours are unchanged. No confluent airspace disease to suggest pneumonia. No pleural effusion or pneumothorax.  IMPRESSION: 1.  No acute pulmonary process. 2. Left upper lobe pulmonary nodule, as seen on recent chest CT. Additional pulmonary nodules on CT are not seen radiographically.   Electronically Signed   By: Jeb Levering M.D.   On: 05/19/2015 05:38    Microbiology: Recent Results (from the past 240 hour(s))  C difficile quick scan w PCR reflex     Status: None   Collection Time: 06/04/15  3:55 PM  Result Value Ref Range Status   C Diff antigen NEGATIVE NEGATIVE Final   C Diff toxin NEGATIVE NEGATIVE Final   C Diff interpretation Negative for toxigenic C. difficile  Final  Urine culture     Status: None   Collection Time: 06/09/15  7:39 PM  Result Value Ref Range Status   Specimen Description URINE, CLEAN CATCH  Final   Special Requests Normal  Final   Culture   Final    60,000 COLONIES/ml YEAST Performed at [facility]    Report Status 06/11/2015 FINAL  Final     Labs: Basic Metabolic Panel:  Recent Labs Lab 06/08/15 0426 06/10/15 0420 06/11/15 0430  NA 138 138 137  K 3.6 3.5 4.1  CL 112* 113* 110  CO2 17*  19* 19*  GLUCOSE 71 116* 109*  BUN <5* <5* <5*  CREATININE 0.57 0.61 0.57  CALCIUM 9.3 9.4 10.0   Liver Function Tests:  Recent Labs Lab 06/10/15 0420  AST 10*  ALT 6*  ALKPHOS 43  BILITOT 0.8  PROT 4.2*  ALBUMIN 1.8*   No results for input(s): LIPASE, AMYLASE in the last 168 hours. No results for input(s): AMMONIA in the last 168 hours. CBC:  Recent Labs Lab 06/08/15 0426 06/10/15 0420 06/11/15 0430  WBC 17.2* 15.7* 16.2*  NEUTROABS  --  13.3*  --   HGB 9.0* 8.4* 9.6*  HCT 28.2* 26.8* 29.5*  MCV 98.3 100.8* 100.3*  PLT 173 194 204   Cardiac Enzymes: No results for input(s): CKTOTAL, CKMB, CKMBINDEX, TROPONINI in the last 168 hours. BNP: BNP (last 3 results) No results for input(s): BNP in the last 8760 hours.  ProBNP (last 3 results) No results for input(s): PROBNP in the last 8760 hours.  CBG: No results for input(s): GLUCAP in the last 168 hours.  Time coordinating discharge: 35 minutes  Signed:  Tanazia Achee  [facility] 06/14/2015, 3:41 PM

## 2015-06-14 NOTE — Progress Notes (Signed)
Pt for discharge to [facility].  CSW facilitated pt discharge needs including contacting facility, faxing pt discharge information to [facility], discussing with pt and pt daughter at bedside, discussing with pt significant other, Doug via telephone, providing RN phone number to call report, and arranging ambulance transport to [facility].  Pt and pt daughter tearful regarding transition to [place], but expressed understanding when CSW explained that pt no longer meeting medical necessity to remain in the hospital. CSW provided emotional support.   No further social work needs identified at this time.  CSW signing off.   Alison Murray, MSW, [place] Work [phone]

## 2015-06-14 NOTE — Progress Notes (Signed)
   06/14/15 1000  Clinical Encounter Type  Visited With Patient  Visit Type Follow-up;Spiritual support  Referral From Physician  Consult/Referral To Chaplain  Spiritual Encounters  Spiritual Needs Emotional;Other (Comment) (Pastoral Conversation)  Stress Factors  Patient Stress Factors None identified   The Chaplain visited with the patient for a follow-up visit. The patient was asleep when the Chaplain arrived, but opened her eyes. The Chaplain asked the patient if she would like for her to return and the patient replied, "yes."  The Chaplain will follow up with the patient at a later time.   [place]

## 2015-06-14 NOTE — Progress Notes (Signed)
Report has been called for this patient. Awaiting transfer at this time

## 2015-06-14 NOTE — Clinical Social Work Placement (Signed)
   CLINICAL SOCIAL WORK PLACEMENT  NOTE  Date:  06/14/2015  Patient Details  Name: Carly Schwartz MRN: [id number] Date of Birth: 30-Nov-1945  Clinical Social Work is seeking post-discharge placement for this patient at the [place] level of care (*CSW will initial, date and re-position this form in  chart as items are completed):  Yes   Patient/family provided with [place] Work Department's list of facilities offering this level of care within the geographic area requested by the patient (or if unable, by the patient's family).  Yes   Patient/family informed of their freedom to choose among providers that offer the needed level of care, that participate in Medicare, Medicaid or managed care program needed by the patient, have an available bed and are willing to accept the patient.  Yes   Patient/family informed of 's ownership interest in [facility] and [facility], as well as of the fact that they are under no obligation to receive care at these facilities.  PASRR submitted to EDS on 06/06/15     PASRR number received on 06/06/15     Existing PASRR number confirmed on       FL2 transmitted to all facilities in geographic area requested by pt/family on 06/06/15     FL2 transmitted to all facilities within larger geographic area on       Patient informed that his/her managed care company has contracts with or will negotiate with certain facilities, including the following:        Yes   Patient/family informed of bed offers received.  Patient chooses bed at [facility] and patient chooses bed at      Patient to be transferred to [facility] on 06/14/15.  Patient to be transferred to facility by ambulance Corey Harold)     Patient family notified on 06/14/15 of transfer.  Name of family member notified:  pt, pt daughter, and pt significant other notified      PHYSICIAN Please sign FL2, Please sign DNR     Additional Comment:    _______________________________________________ Ladell Pier, LCSW 06/14/2015, 3:10 PM

## 2015-06-14 NOTE — Care Management Important Message (Signed)
Important Message  Patient Details  Name: Carly Schwartz MRN: [id number] Date of Birth: [birth year]   Medicare Important Message Given:  Yes-fourth notification given    Camillo Flaming 06/14/2015, 1:16 [place] Message  Patient Details  Name: Carly Schwartz MRN: [id number] Date of Birth: 1946/06/03   Medicare Important Message Given:  Yes-fourth notification given    Camillo Flaming 06/14/2015, 1:16 PM

## 2015-06-14 NOTE — Progress Notes (Signed)
CSW continuing to follow.   CSW discussed with MD this morning regarding disposition plan. Per MD, pt wants to continue radiation treatments and MD requested for [facility] to review pt information to determine if pt was appropriate for [facility]. CSW spoke with [facility], Erling Conte who was agreeable to speaking with pt and reviewing pt information. CSW received update from [facility], Erling Conte that she spoke with pt at bedside and pt wishes to go to rehab at [facility] in hopes to return home and wants to continue radiation treatments.   CSW received message from pt significant other, Kathlyn [place] stating that pt and pt significant other wanted [facility] for pt needs. CSW met with pt and pt daughter at bedside. Pt very sleepy at this time, but confirmed plan. CSW provided support to pt and provided education to pt surrounding that pt will have to participate in physical therapy at [place] in order for pt insurance to cover SNF placement. Pt expressed understanding. Pt daughter stated that pt and pt significant other, Marden Noble make decisions together. Pt provided permission for CSW to contact pt significant other, Doug via telephone.   CSW contacted pt significant other, Doug via telephone. Pt significant other confirmed plans for [facility]. CSW discussed with pt significant other that it will be important for pt to participate in physical therapy and pt significant other expressed that pt is aware of that. CSW discussed that CSW will check with MD regarding discharge, but it is likely that pt will be medically ready for discharge today. Pt significant other expressed understanding.  CSW contacted [facility] and facility confirmed that they can accept pt today if medically ready for discharge. [facility] confirmed that facility can provide transport for pt to radiation treatments.   CSW notified MD and MD stated that pt  medically stable for discharge today.   CSW to facilitate pt discharge needs this afternoon.  Alison Murray, MSW, [place] Work [phone]

## 2015-06-15 ENCOUNTER — Ambulatory Visit: Admit: 2015-06-15 | Payer: Medicare Other | Admitting: Radiation Oncology

## 2015-06-15 ENCOUNTER — Ambulatory Visit
Admit: 2015-06-15 | Discharge: 2015-06-15 | Disposition: A | Discharge status: Home / Self Care | Payer: Medicare Other | Attending: Radiation Oncology | Admitting: Radiation Oncology

## 2015-06-16 ENCOUNTER — Ambulatory Visit
Admission: RE | Admit: 2015-06-16 | Discharge: 2015-06-16 | Disposition: A | Discharge status: Home / Self Care | Payer: Medicare Other | Source: Ambulatory Visit | Attending: Radiation Oncology | Admitting: Radiation Oncology

## 2015-06-17 ENCOUNTER — Non-Acute Institutional Stay (SKILLED_NURSING_FACILITY): Payer: Medicare Other | Admitting: Internal Medicine

## 2015-06-17 ENCOUNTER — Encounter: Payer: Self-pay | Admitting: Radiation Oncology

## 2015-06-17 ENCOUNTER — Ambulatory Visit
Admission: RE | Admit: 2015-06-17 | Discharge: 2015-06-17 | Disposition: A | Discharge status: Home / Self Care | Payer: Medicare Other | Source: Ambulatory Visit | Attending: Radiation Oncology | Admitting: Radiation Oncology

## 2015-06-17 ENCOUNTER — Encounter: Payer: Self-pay | Admitting: Internal Medicine

## 2015-06-17 ENCOUNTER — Ambulatory Visit
Admit: 2015-06-17 | Discharge: 2015-06-17 | Disposition: A | Discharge status: Home / Self Care | Payer: Medicare Other | Attending: Radiation Oncology | Admitting: Radiation Oncology

## 2015-06-17 VITALS — BP 113/60 | HR 55 | Temp 97.5°F | Resp 16

## 2015-06-17 DIAGNOSIS — F419 Anxiety disorder, unspecified: Secondary | ICD-10-CM

## 2015-06-17 DIAGNOSIS — J9601 Acute respiratory failure with hypoxia: Secondary | ICD-10-CM | POA: Diagnosis not present

## 2015-06-17 DIAGNOSIS — E43 Unspecified severe protein-calorie malnutrition: Secondary | ICD-10-CM

## 2015-06-17 DIAGNOSIS — C519 Malignant neoplasm of vulva, unspecified: Secondary | ICD-10-CM

## 2015-06-17 DIAGNOSIS — F32A Depression, unspecified: Secondary | ICD-10-CM

## 2015-06-17 DIAGNOSIS — F329 Major depressive disorder, single episode, unspecified: Secondary | ICD-10-CM | POA: Diagnosis not present

## 2015-06-17 DIAGNOSIS — B3749 Other urogenital candidiasis: Secondary | ICD-10-CM

## 2015-06-17 DIAGNOSIS — D72829 Elevated white blood cell count, unspecified: Secondary | ICD-10-CM | POA: Diagnosis not present

## 2015-06-17 DIAGNOSIS — R112 Nausea with vomiting, unspecified: Secondary | ICD-10-CM

## 2015-06-17 DIAGNOSIS — I9589 Other hypotension: Secondary | ICD-10-CM | POA: Diagnosis not present

## 2015-06-17 DIAGNOSIS — D509 Iron deficiency anemia, unspecified: Secondary | ICD-10-CM | POA: Diagnosis not present

## 2015-06-17 NOTE — Progress Notes (Signed)
  Radiation Oncology         [phone]) [phone] ________________________________  Name: Carly Schwartz MRN: [id number]  Date: 06/17/2015  DOB: 12/05/1945  Weekly Radiation Therapy Management    ICD-9-CM ICD-10-CM   1. Vulvar cancer, carcinoma (HCC) 184.4 C51.9      Current Dose: 18 Gy     Planned Dose:  30 Gy  Narrative . . . . . . . . The patient presents for routine under treatment assessment.                                   Caliah Kopke has completed 9 fractions to her pelvis. She denies pain when laying down. She does have pain with sitting. She denies having nausea now. She reports having a poor appetite and is not eating very much. She reports having occasional diarrhea. She reports fatigue. She is currently staying at [facility].                                 Set-up films were reviewed.                                 The chart was checked. Physical Findings. . .  oral temperature is 97.5 F (36.4 C). Her blood pressure is 113/60 and her pulse is 55. Her respiration is 16 and oxygen saturation is 96%.  The patient is supine in a stretcher. Lungs are clear to auscultation bilaterally. Heart has regular rate and rhythm. Impression . . . . . . . The patient is tolerating radiation. Plan . . . . . . . . . . . . Continue treatment as planned. We will examine treatment area next Tuesday  This document serves as a record of services personally performed by Gery Pray, MD. It was created on his behalf by Darcus Austin, a trained medical scribe. The creation of this record is based on the scribe's personal observations and the provider's statements to them. This document has been checked and approved by the attending provider.  ________________________________   Blair Promise, PhD, MD

## 2015-06-17 NOTE — Progress Notes (Signed)
Carly Schwartz has completed 9 fractions to her pelvis.  She denies pain when laying down.  She does have pain with sitting.  She denies having nausea now.  She reports having a poor appetite and is not eating very much.  She reports having occasional diarrhea.  She reports fatigue.  She is currently staying at [facility].    BP 113/60 mmHg  Pulse 55  Temp(Src) 97.5 F (36.4 C) (Oral)  Resp 16  SpO2 96%

## 2015-06-17 NOTE — Progress Notes (Signed)
MRN: [id number] Name: Carly Schwartz  Sex: female Age: 69 y.o. DOB: 30-May-1946  [place] #: Helene Kelp Facility/Room:311 Level Of Care: SNF Provider: Inocencio Homes D Emergency Contacts: Extended Emergency Contact Information Primary Emergency Contact: Charlotte Crumb of [place] Phone: [phone] Mobile Phone: [phone] Relation: Son Secondary Emergency Contact: Rachel,Doug Address: [address]          [place], [place] [address] [place] Phone: [phone] Relation: Significant other  Code Status:   Allergies: Review of patient's allergies indicates no known allergies.  Chief Complaint  Patient presents with  . New Admit To SNF    HPI: Patient is 69 y.o. 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 Hospitalized in September 2016 for suspected colitis when she was found to have the vulvar mass. Pathology revealed squamous cell carcinoma. PET scan on 06/01/2015 showing primary vulvar carcinoma with metastasis to pelvic lymph nodes, lungs and hilar lymph nodes.  Since being discharged home she had ongoing nausea and poor appetite with weakness. She was referred to the ED from radiation oncology clinic where she was being evaluated for palliative radiation. She was found to have orthostatic hypotension with SBP in the 80s and was admitted to the hospital from 9/28 - 10/10 for sx control. Pt is now admitted to SNF for generalized weakness and supportive care.While at SNF pt will be followed for anxiety, tx with ativan, depression tx with remeron and protein malnutrition tx with liberal diet and supplements.  Past Medical History  Diagnosis Date  . Hypertension   . Vulvar cancer [facility])     Past Surgical History  Procedure Laterality Date  . Tubal ligation         Medication List       This list is accurate as of: 06/17/15 11:59 PM.  Always use your most recent med list.               dexamethasone 4 MG tablet  Commonly known as:  DECADRON  Take 1 tablet (4 mg total) by mouth 2 (two) times daily with a meal.     feeding supplement Liqd  Take 1 Container by mouth 2 (two) times daily between meals.     fluconazole 100 MG tablet  Commonly known as:  DIFLUCAN  Take 1 tablet (100 mg total) by mouth daily.     lidocaine 2 % jelly  Commonly known as:  XYLOCAINE  Apply 1 application topically as directed. Apply to perineum for pain up to 3 times per day.  Do NOT use four hours prior to radiation.     LORazepam 0.5 MG tablet  Commonly known as:  ATIVAN  Take 1 tablet (0.5 mg total) by mouth every 6 (six) hours as needed for anxiety.     midodrine 5 MG tablet  Commonly known as:  PROAMATINE  Take 3 tablets (15 mg total) by mouth 3 (three) times daily with meals.     mirtazapine 15 MG tablet  Commonly known as:  REMERON  Take 1 tablet (15 mg total) by mouth at bedtime.     morphine CONCENTRATE 10 MG/0.5ML Soln concentrated solution  Take 0.25 mLs (5 mg total) by mouth every 2 (two) hours as needed for moderate pain, severe pain or shortness of breath.     ondansetron 4 MG disintegrating tablet  Commonly known as:  ZOFRAN ODT  Take 1 tablet (4 mg total) by mouth 3 (three) times daily before meals.     promethazine 12.5 MG suppository  Commonly known as:  PHENERGAN  Place 12.5 mg rectally every 6 (six) hours as needed for nausea or vomiting.     promethazine 12.5 MG tablet  Commonly known as:  PHENERGAN  Take 1 tablet (12.5 mg total) by mouth every 6 (six) hours as needed for nausea or vomiting.        No orders of the defined types were placed in this encounter.    There is no immunization history for the selected administration types on file for this patient.  Social History  Substance Use Topics  . Smoking status:  Former Smoker -- 1.00 packs/day for 15 years    Types: Cigarettes    Quit date: 02/16/2015  . Smokeless tobacco: Not on file  . Alcohol Use: No    Family history is    Review of Systems  DATA OBTAINED: from patient, nurse, medical record, family member GENERAL:  no fevers, fatigue, appetite changes SKIN: No itching, rash or wounds EYES: No eye pain, redness, discharge EARS: No earache, tinnitus, change in hearing NOSE: No congestion, drainage or bleeding  MOUTH/THROAT: No mouth or tooth pain, No sore throat RESPIRATORY: No cough, wheezing, SOB CARDIAC: No chest pain, palpitations, lower extremity edema  GI: No abdominal pain, No N/V/D or constipation, No heartburn or reflux  GU: No dysuria, frequency or urgency, or incontinence  MUSCULOSKELETAL: No unrelieved bone/joint pain NEUROLOGIC: No headache, dizziness or focal weakness PSYCHIATRIC: No c/o anxiety or sadness   Filed Vitals:   06/17/15 2052  BP: 105/71  Pulse: 79  Temp: 97.1 F (36.2 C)  Resp: 16    SpO2 Readings from Last 1 Encounters:  06/17/15 93%        Physical Exam  GENERAL APPEARANCE: Alert, conversant,  No acute distress.  SKIN: No diaphoresis rash HEAD: Normocephalic, atraumatic  EYES: Conjunctiva/lids clear. Pupils round, reactive. EOMs intact.  EARS: External exam WNL, canals clear. Hearing grossly normal.  NOSE: No deformity or discharge.  MOUTH/THROAT: Lips w/o lesions  RESPIRATORY: Breathing is even, unlabored. Lung sounds are clear   CARDIOVASCULAR: Heart RRR no murmurs, rubs or gallops. No peripheral edema.   GASTROINTESTINAL: Abdomen is soft, non-tender, not distended w/ normal bowel sounds. GENITOURINARY: Bladder non tender, not distended  MUSCULOSKELETAL: No abnormal joints or musculature NEUROLOGIC:  Cranial nerves 2-12 grossly intact. Moves all extremities  PSYCHIATRIC: Mood and affect appropriate to situation, no behavioral issues  Patient Active Problem List   Diagnosis Date  Noted  . Leukocytosis 06/21/2015  . Refractory nausea and vomiting 06/21/2015  . Depression 06/21/2015  . Anemia, iron deficiency 06/21/2015  . Acute respiratory failure with hypoxemia ([place]) 06/21/2015  . Anxiety 06/21/2015  . DNR (do not resuscitate) discussion 06/11/2015  . Pain, cancer 06/11/2015  . Arterial hypotension   . Palliative care encounter   . Iron deficiency anemia   . Protein-calorie malnutrition, severe ([place]) 06/03/2015  . Hospital discharge follow-up 05/31/2015  . Other specified hypotension 05/31/2015  . Vulvar cancer, carcinoma ([place]) 05/31/2015  . Malnutrition ([place]) 05/28/2015  . Anorexia 05/28/2015  . Pelvic mass   . Septic shock ([place])   . Vaginal mass   . Sepsis ([place]) 05/19/2015  . Vulvar mass 05/19/2015  . Shock ([place]) 05/18/2015  . Hypoglycemia 04/20/2015  . Severe sepsis ([place]) 04/20/2015  . Elevated lactic acid level   .  Hypokalemia   . Candida UTI 04/18/2015  . Orthostatic hypotension 04/18/2015  . Near syncope 04/18/2015  . Multiple pulmonary nodules 04/18/2015    CBC    Component Value Date/Time   WBC 16.2* 06/11/2015 0430   RBC 2.94* 06/11/2015 0430   RBC 2.78* 05/22/2015 1250   HGB 9.6* 06/11/2015 0430   HCT 29.5* 06/11/2015 0430   PLT 204 06/11/2015 0430   MCV 100.3* 06/11/2015 0430   LYMPHSABS 0.6* 06/10/2015 0420   MONOABS 0.7 06/10/2015 0420   EOSABS 1.1* 06/10/2015 0420   BASOSABS 0.0 06/10/2015 0420    CMP     Component Value Date/Time   NA 137 06/11/2015 0430   K 4.1 06/11/2015 0430   CL 110 06/11/2015 0430   CO2 19* 06/11/2015 0430   GLUCOSE 109* 06/11/2015 0430   BUN <5* 06/11/2015 0430   CREATININE 0.57 06/11/2015 0430   CALCIUM 10.0 06/11/2015 0430   PROT 4.2* 06/10/2015 0420   ALBUMIN 1.8* 06/10/2015 0420   AST 10* 06/10/2015 0420   ALT 6* 06/10/2015 0420   ALKPHOS 43 06/10/2015 0420   BILITOT 0.8 06/10/2015 0420   GFRNONAA >60 06/11/2015 0430   GFRAA >60 06/11/2015 0430    No results found for: HGBA1C    No results found.  Not all labs, radiology exams or other studies done during hospitalization come through on my EPIC note; however they are reviewed by me.    Assessment and Plan  Vulvar cancer, carcinoma ([place]) having vulvar pain, nausea, anorexia. She was seen by oncology who felt that given her weakness and inability to eat that she would not be a good candidate for chemotherapy. She was started on palliative radiation by radiation oncology on 10/3 and will continue treatments through 10/21, then stop. She met with palliative care and clarified her goals of care, which are to increase her strength and nutrition if possible. If she continues to decline, she will like to transition to hospice care/residential hospice. SNF - palliative has been consulted  Leukocytosis without fever. I suspect this is related to her cancer. CXR without new infiltrate and urine culture grew no bacteria, only a few colonies of yeast. Anticipate worsening now that she has started dexamethasone    Arterial hypotension not fluid responsive. Cortisol level was 19. She was started on dexamethasone for nausea which had no effect on Her blood pressure. She was started on midodrine but remained hypotensive. I believe that her hypotension is related to her malignancy SNF - pt has enough BP to sit up in WC; cont midodrine and will cont to monitor  Refractory nausea and vomiting slight improvement at drinking a little. She was transitioned to sublingual zofran and phenergan as needed. She should also take dexamethasone 30m twice a day. SNF - cont decadron, phenergan and zofran  Depression SNF - cont to work with pt as well as palliative consult; cont remeron 15 mg q HS  Iron deficiency anemia . She was given an infusion of feraheme; SNF - Oncology has noted they will order the CBC's  Acute respiratory failure with hypoxemia ([place] with albumin 1.8. Likely has pulmonary edema from low albumin. She  was given intermittent doses of lasix which improved her SOB but could not be continued due to hypotension; SNF - O2 and prn lasix as tolerated  Candida UTI She did not have a urinary catheter. She was started on a two week course of fluconazole.SNF -Cont diflucan for 2 week course  Protein-calorie malnutrition, severe SNF -  cont liberal diet and supplements  Anxiety SNF - lorazepam 0.5 q 6 prn   Time spent 45 min;> 50% of time with patient was spent reviewing records, labs, tests and studies, counseling and developing plan of care  Hennie Duos, MD

## 2015-06-18 ENCOUNTER — Ambulatory Visit
Admit: 2015-06-18 | Discharge: 2015-06-18 | Disposition: A | Discharge status: Home / Self Care | Payer: Medicare Other | Attending: Radiation Oncology | Admitting: Radiation Oncology

## 2015-06-20 ENCOUNTER — Encounter: Payer: Self-pay | Admitting: Internal Medicine

## 2015-06-21 ENCOUNTER — Ambulatory Visit
Admit: 2015-06-21 | Discharge: 2015-06-21 | Disposition: A | Discharge status: Home / Self Care | Payer: Medicare Other | Attending: Radiation Oncology | Admitting: Radiation Oncology

## 2015-06-21 DIAGNOSIS — D72829 Elevated white blood cell count, unspecified: Secondary | ICD-10-CM | POA: Insufficient documentation

## 2015-06-21 DIAGNOSIS — F329 Major depressive disorder, single episode, unspecified: Secondary | ICD-10-CM | POA: Insufficient documentation

## 2015-06-21 DIAGNOSIS — J9601 Acute respiratory failure with hypoxia: Secondary | ICD-10-CM | POA: Insufficient documentation

## 2015-06-21 DIAGNOSIS — D509 Iron deficiency anemia, unspecified: Secondary | ICD-10-CM | POA: Insufficient documentation

## 2015-06-21 DIAGNOSIS — F419 Anxiety disorder, unspecified: Secondary | ICD-10-CM | POA: Insufficient documentation

## 2015-06-21 DIAGNOSIS — F32A Depression, unspecified: Secondary | ICD-10-CM | POA: Insufficient documentation

## 2015-06-21 DIAGNOSIS — R112 Nausea with vomiting, unspecified: Secondary | ICD-10-CM | POA: Insufficient documentation

## 2015-06-21 NOTE — Assessment & Plan Note (Signed)
SNF - lorazepam 0.5 q 6 prn

## 2015-06-21 NOTE — Assessment & Plan Note (Signed)
She did not have a urinary catheter. She was started on a two week course of fluconazole.SNF -Cont diflucan for 2 week course

## 2015-06-21 NOTE — Assessment & Plan Note (Signed)
.   She was given an infusion of feraheme; SNF - Oncology has noted they will order the CBC's

## 2015-06-21 NOTE — Assessment & Plan Note (Signed)
slight improvement at drinking a little. She was transitioned to sublingual zofran and phenergan as needed. She should also take dexamethasone 4mg  twice a day. SNF - cont decadron, phenergan and zofran

## 2015-06-21 NOTE — Assessment & Plan Note (Signed)
not fluid responsive. Cortisol level was 19. She was started on dexamethasone for nausea which had no effect on Her blood pressure. She was started on midodrine but remained hypotensive. I believe that her hypotension is related to her malignancy SNF - pt has enough BP to sit up in WC; cont midodrine and will cont to monitor

## 2015-06-21 NOTE — Assessment & Plan Note (Addendum)
without fever. I suspect this is related to her cancer. CXR without new infiltrate and urine culture grew no bacteria, only a few colonies of yeast. Anticipate worsening now that she has started dexamethasone

## 2015-06-21 NOTE — Assessment & Plan Note (Signed)
having vulvar pain, nausea, anorexia. She was seen by oncology who felt that given her weakness and inability to eat that she would not be a good candidate for chemotherapy. She was started on palliative radiation by radiation oncology on 10/3 and will continue treatments through 10/21, then stop. She met with palliative care and clarified her goals of care, which are to increase her strength and nutrition if possible. If she continues to decline, she will like to transition to hospice care/residential hospice. SNF - palliative has been consulted

## 2015-06-21 NOTE — Assessment & Plan Note (Signed)
SNF - cont liberal diet and supplements

## 2015-06-21 NOTE — Assessment & Plan Note (Addendum)
SNF - cont to work with pt as well as palliative consult; cont remeron 15 mg q HS

## 2015-06-21 NOTE — Assessment & Plan Note (Signed)
with albumin 1.8. Likely has pulmonary edema from low albumin. She was given intermittent doses of lasix which improved her SOB but could not be continued due to hypotension; SNF - O2 and prn lasix as tolerated

## 2015-06-22 ENCOUNTER — Encounter: Payer: Self-pay | Admitting: Radiation Oncology

## 2015-06-22 ENCOUNTER — Ambulatory Visit
Admission: RE | Admit: 2015-06-22 | Discharge: 2015-06-22 | Disposition: A | Discharge status: Home / Self Care | Payer: Medicare Other | Source: Ambulatory Visit | Attending: Radiation Oncology | Admitting: Radiation Oncology

## 2015-06-22 ENCOUNTER — Ambulatory Visit
Admit: 2015-06-22 | Discharge: 2015-06-22 | Disposition: A | Discharge status: Home / Self Care | Payer: Medicare Other | Attending: Radiation Oncology | Admitting: Radiation Oncology

## 2015-06-22 VITALS — BP 108/72 | HR 78 | Resp 16

## 2015-06-22 DIAGNOSIS — C519 Malignant neoplasm of vulva, unspecified: Secondary | ICD-10-CM

## 2015-06-22 NOTE — Progress Notes (Signed)
  Radiation Oncology         [phone]) [phone] ________________________________  Name: Carly Schwartz MRN: [id number]  Date: 06/22/2015  DOB: 1946/08/24  Weekly Radiation Therapy Management    ICD-9-CM ICD-10-CM   1. Vulvar cancer, carcinoma (HCC) 184.4 C51.9       Current Dose: 24 Gy     Planned Dose:  30 Gy  Narrative . . . . . . . . The patient presents for routine under treatment assessment.                                   Denies pain while supine. Reports perineal discomfort when sitting up. Denies nausea at this time. Reports "some diarrhea." Reports a poor appetite related to the dislike of food at [place]. Reports she is able to ambulate a few small steps with PT at [facility]. Reports fatigue.                                  Set-up films were reviewed.                                 The chart was checked. Physical Findings. . .  blood pressure is 108/72 and her pulse is 78. Her respiration is 16 and oxygen saturation is 100%. . Pelvic exam- the patient has extensive vulvar mass decreased little in size thus far. No active drainage or bleeding. She continues to have significant palpable left inguinal node. No skin erosion Impression . . . . . . . The patient is tolerating radiation. Plan . . . . . . . . . . . . Continue treatment as planned for three additional tx's. ________________________________   Blair Promise, PhD, MD

## 2015-06-22 NOTE — Progress Notes (Signed)
Vitals stable. Denies pain while supine. Reports perineal discomfort when sitting up. Denies nausea at this time. Reports "some diarrhea." Reports a poor appetite related to the dislike of food at [place]. Reports she is able to ambulate a few small steps with PT at [facility]. Reports fatigue.   BP 108/72 mmHg  Pulse 78  Resp 16  SpO2 100% Wt Readings from Last 3 Encounters:  06/03/15 106 lb 7.7 oz (48.3 kg)  06/02/15 93 lb 4.8 oz (42.321 kg)  05/31/15 101 lb 3.2 oz (45.904 kg)  '

## 2015-06-23 ENCOUNTER — Ambulatory Visit
Admit: 2015-06-23 | Discharge: 2015-06-23 | Disposition: A | Discharge status: Home / Self Care | Payer: Medicare Other | Attending: Radiation Oncology | Admitting: Radiation Oncology

## 2015-06-24 ENCOUNTER — Ambulatory Visit
Admit: 2015-06-24 | Discharge: 2015-06-24 | Disposition: A | Discharge status: Home / Self Care | Payer: Medicare Other | Attending: Radiation Oncology | Admitting: Radiation Oncology

## 2015-06-25 ENCOUNTER — Non-Acute Institutional Stay (SKILLED_NURSING_FACILITY): Payer: Medicare Other | Admitting: Nurse Practitioner

## 2015-06-25 ENCOUNTER — Ambulatory Visit
Admission: RE | Admit: 2015-06-25 | Discharge: 2015-06-25 | Disposition: A | Discharge status: Home / Self Care | Payer: Medicare Other | Source: Ambulatory Visit | Attending: Radiation Oncology | Admitting: Radiation Oncology

## 2015-06-25 ENCOUNTER — Encounter: Payer: Self-pay | Admitting: Nurse Practitioner

## 2015-06-25 ENCOUNTER — Encounter: Payer: Self-pay | Admitting: Radiation Oncology

## 2015-06-25 DIAGNOSIS — R112 Nausea with vomiting, unspecified: Secondary | ICD-10-CM | POA: Diagnosis not present

## 2015-06-25 DIAGNOSIS — F329 Major depressive disorder, single episode, unspecified: Secondary | ICD-10-CM | POA: Diagnosis not present

## 2015-06-25 DIAGNOSIS — E43 Unspecified severe protein-calorie malnutrition: Secondary | ICD-10-CM

## 2015-06-25 DIAGNOSIS — B3749 Other urogenital candidiasis: Secondary | ICD-10-CM

## 2015-06-25 DIAGNOSIS — R918 Other nonspecific abnormal finding of lung field: Secondary | ICD-10-CM | POA: Diagnosis not present

## 2015-06-25 DIAGNOSIS — C519 Malignant neoplasm of vulva, unspecified: Secondary | ICD-10-CM | POA: Diagnosis not present

## 2015-06-25 DIAGNOSIS — F32A Depression, unspecified: Secondary | ICD-10-CM

## 2015-06-25 DIAGNOSIS — I9589 Other hypotension: Secondary | ICD-10-CM

## 2015-06-25 NOTE — Progress Notes (Signed)
Patient ID: DABRIA WADAS, female   DOB: 08-15-1946, 69 y.o.   MRN: [id number]     Nursing Home Location:  [facility]   Code Status: DNR  Place of Service: SNF (31)  PCP: Zada Finders, MD  No Known Allergies  Chief Complaint  Patient presents with  . Discharge Note    Discharge from SNF    HPI:  Patient is a 69 y.o. female seen today at [facility] for discharge home. 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 Pt was hospitalized in September 2016 for suspected colitis when she was found to have the vulvar mass. Pathology revealed squamous cell carcinoma. PET scan on 06/01/2015 showing primary vulvar carcinoma with metastasis to pelvic lymph nodes, lungs and hilar lymph nodes.  Since being discharged home she had ongoing nausea and poor appetite with weakness. Pt was admitted to the hospital from 9/28 - 10/10 for symptom control. Pt at SNF for generalized weakness and supportive care. Patient stable to discharge home with home health, family and palliative care.  Review of Systems:  Review of Systems  Constitutional: Negative for activity change, appetite change, fatigue and unexpected weight change.  HENT: Negative for congestion and hearing loss.   Eyes: Negative.   Respiratory: Negative for cough and shortness of breath.   Cardiovascular: Negative for chest pain, palpitations and leg swelling.  Gastrointestinal: Positive for nausea (controlled at this time). Negative for abdominal pain, diarrhea and constipation.  Genitourinary: Negative for dysuria and difficulty urinating.  Musculoskeletal: Negative for myalgias and arthralgias.  Skin: Negative for color change and wound.  Neurological: Positive for weakness (generalized). Negative for dizziness.  Psychiatric/Behavioral: Negative for behavioral problems, confusion and agitation.    Past Medical History  Diagnosis Date   . Hypertension   . Vulvar cancer [facility])    Past Surgical History  Procedure Laterality Date  . Tubal ligation     Social History:   reports that she quit smoking about 4 months ago. Her smoking use included Cigarettes. She has a 15 pack-year smoking history. She does not have any smokeless tobacco history on file. She reports that she does not drink alcohol or use illicit drugs.  Family History  Problem Relation Age of Onset  . Uterine cancer Mother   . Cancer Brother     Medications: Patient's Medications  New Prescriptions   No medications on file  Previous Medications   DEXAMETHASONE (DECADRON) 4 MG TABLET    Take 1 tablet (4 mg total) by mouth 2 (two) times daily with a meal.   FLUCONAZOLE (DIFLUCAN) 100 MG TABLET    Take 1 tablet (100 mg total) by mouth daily.   LIDOCAINE (XYLOCAINE) 2 % JELLY    Apply 1 application topically as directed. Apply to perineum for pain up to 3 times per day.  Do NOT use four hours prior to radiation.   LORAZEPAM (ATIVAN) 0.5 MG TABLET    Take 1 tablet (0.5 mg total) by mouth every 6 (six) hours as needed for anxiety.   MIDODRINE (PROAMATINE) 5 MG TABLET    Take 3 tablets (15 mg total) by mouth 3 (three) times daily with meals.   MIRTAZAPINE (REMERON) 15 MG TABLET    Take 1 tablet (15 mg total) by mouth at bedtime.   MORPHINE SULFATE (MORPHINE CONCENTRATE) 10 MG/0.5ML SOLN CONCENTRATED SOLUTION    Take 0.25 mLs (5 mg total) by mouth every 2 (two)  hours as needed for moderate pain, severe pain or shortness of breath.   ONDANSETRON (ZOFRAN ODT) 4 MG DISINTEGRATING TABLET    Take 1 tablet (4 mg total) by mouth 3 (three) times daily before meals.   PROMETHAZINE (PHENERGAN) 12.5 MG SUPPOSITORY    Place 12.5 mg rectally every 6 (six) hours as needed for nausea or vomiting.  Modified Medications   No medications on file  Discontinued Medications   FEEDING SUPPLEMENT (BOOST / RESOURCE BREEZE) LIQD    Take 1 Container by mouth 2 (two) times daily between  meals.   PROMETHAZINE (PHENERGAN) 12.5 MG TABLET    Take 1 tablet (12.5 mg total) by mouth every 6 (six) hours as needed for nausea or vomiting.     Physical Exam: Filed Vitals:   06/25/15 1153  BP: 124/80  Pulse: 63  Temp: 96.8 F (36 C)  TempSrc: Oral  Resp: 16    Physical Exam  Constitutional: She is oriented to person, place, and time. No distress.  Frail thin female, NAD  HENT:  Head: Normocephalic and atraumatic.  Mouth/Throat: Oropharynx is clear and moist. No oropharyngeal exudate.  Eyes: Conjunctivae are normal. Pupils are equal, round, and reactive to light.  Neck: Normal range of motion. Neck supple.  Cardiovascular: Normal rate, regular rhythm and normal heart sounds.   Pulmonary/Chest: Effort normal and breath sounds normal.  Abdominal: Soft. Bowel sounds are normal.  Musculoskeletal: She exhibits no edema or tenderness.  Neurological: She is alert and oriented to person, place, and time.  Skin: Skin is warm and dry. She is not diaphoretic.  Psychiatric: She has a normal mood and affect.    Labs reviewed: Basic Metabolic Panel:  Recent Labs  05/19/15 1157  06/03/15 0914 06/04/15 0700  06/08/15 0426 06/10/15 0420 06/11/15 0430  NA  --   < > 137 139  < > 138 138 137  K  --   < > 3.1* 3.2*  < > 3.6 3.5 4.1  CL  --   < > 104 109  < > 112* 113* 110  CO2  --   < > 21* 20*  < > 17* 19* 19*  GLUCOSE  --   < > 85 64*  < > 71 116* 109*  BUN  --   < > <5* <5*  < > <5* <5* <5*  CREATININE  --   < > 0.87 0.65  < > 0.57 0.61 0.57  CALCIUM  --   < > 9.0 8.4*  < > 9.3 9.4 10.0  MG 1.6*  --  1.6* 1.9  --   --   --   --   < > = values in this interval not displayed. Liver Function Tests:  Recent Labs  05/18/15 1940 06/02/15 1747 06/10/15 0420  AST 21 12* 10*  ALT 8* 7* 6*  ALKPHOS 63 43 43  BILITOT 1.0 0.8 0.8  PROT 5.3* 5.6* 4.2*  ALBUMIN 2.4* 2.6* 1.8*    Recent Labs  04/17/15 2339  LIPASE 16*   No results for input(s): AMMONIA in the last 8760  hours. CBC:  Recent Labs  06/03/15 0931 06/04/15 0700  06/08/15 0426 06/10/15 0420 06/11/15 0430  WBC  --  11.2*  < > 17.2* 15.7* 16.2*  NEUTROABS 9.8* 9.0*  --   --  13.3*  --   HGB  --  9.2*  < > 9.0* 8.4* 9.6*  HCT  --  29.0*  < > 28.2* 26.8* 29.5*  MCV  --  96.3  < > 98.3 100.8* 100.3*  PLT  --  150  < > 173 194 204  < > = values in this interval not displayed. TSH:  Recent Labs  06/06/15 0813  TSH 4.479   A1C: No results found for: HGBA1C Lipid Panel: No results for input(s): CHOL, HDL, LDLCALC, TRIG, CHOLHDL, LDLDIRECT in the last 8760 hours.  Assessment/Plan 1. Vulvar cancer, carcinoma ([place]) -followed by oncology, She was started on palliative radiation by radiation oncology on 10/3 and will continue treatments through today. following with palliative care.   2. Other specified hypotension - slightly improved, able to sit up and walk some with assistance. conts on midodrine   3. Depression -mood has been stable, conts on remeron 15 mg qhs.   4. Protein-calorie malnutrition, severe ([facility]) -reports appetite has improved slightly, ongoing weight loss, to cont on supplements and remeron 15 mg qhs  5. Candida UTI Resolved, completes diflucan today  6. Multiple pulmonary nodules -without worsening shortness of breath or chest pains, requiring O2 to maintain O2 saturation above 90%  7. Refractory nausea and vomiting -stable, conts on decadron, phenergan and Zofran   8. Generalized weakness Still very weak but stable for discharge home with home health and family care. will need PT/OT/HH aid per home health. DME needed includes hospital bed, WC, rolling walker, 3n1. Will need O2 to at 2L to maintain oxygen level. Rx written.  will need to follow up with PCP within 2 weeks.    Carlos American. Harle Battiest  [facility] [phone] 8 am - 5 pm) [phone] (after hours)

## 2015-06-28 ENCOUNTER — Encounter: Payer: Self-pay | Admitting: Gynecologic Oncology

## 2015-06-30 NOTE — Progress Notes (Signed)
Gynecologic Oncology Multi-Disciplinary Disposition Conference Note  Date of the Conference: 06/30/15  Patient Name: Carly Schwartz  Primary GYN Oncologist: Dr. Everitt Amber  Stage/Disposition:  Disposition is to palliative radiation therapy, which is in process.  Performance status too poor for chemotherapy to be of benefit at this time per Dr. Marko Plume.      This Multidisciplinary conference took place involving physicians from [place], [place], Radiation Oncology, Pathology, Radiology along with the Gynecologic Oncology Nurse Practitioner and RN.  Comprehensive assessment of the patient's malignancy, staging, need for surgery, chemotherapy, radiation therapy, and need for further testing were reviewed. Supportive measures, both inpatient and following discharge were also discussed. The recommended plan of care is documented. Greater than 35 minutes were spent correlating and coordinating this patient's care.

## 2015-07-01 NOTE — Progress Notes (Signed)
  Radiation Oncology         (336) [phone] ________________________________  Name: Carly Schwartz MRN: [id number]  Date: 06/25/2015  DOB: [birth year]  End of Treatment Note  ICD-9-CM ICD-10-CM     1. Vulvar cancer, carcinoma 184.4 C51.9     DIAGNOSIS: Stage IV squamous cell carcinoma of the vulva     Indication for treatment:  Local regional control and pain       Radiation treatment dates:   06/07/2015-06/25/2015  Site/dose:   Lower pelvis 30 gray in 15 fractions  Beams/energy:   3-D conformal 6 megavoltage photons  Narrative: The patient tolerated radiation treatment relatively well.   She had some mild improvement in her perineal pain and was able to sit up a little better. Her tumor mass decreased somewhat in size.  Plan: The patient has completed radiation treatment. The patient will return to radiation oncology clinic for routine followup in one month. I advised them to call or return sooner if they have any questions or concerns related to their recovery or treatment. Will consider additional radiation therapy if her performance status has improved at one-month follow-up  -----------------------------------  Blair Promise, PhD, MD

## 2015-07-27 ENCOUNTER — Encounter: Payer: Self-pay | Admitting: Student

## 2015-07-28 ENCOUNTER — Encounter: Payer: Self-pay | Admitting: Internal Medicine

## 2015-07-28 ENCOUNTER — Encounter: Payer: Medicare Other | Admitting: Internal Medicine

## 2015-08-04 ENCOUNTER — Telehealth: Payer: Self-pay | Admitting: *Deleted

## 2015-08-04 NOTE — Telephone Encounter (Signed)
[facility] with Dr. Mariana Kaufman orders.  Vickie RN will check EPIC and call to arrange a visit.  Vickie asked if Dr. Marko Plume wants symptom management.  Instructed to follow symptom management orders.  Patient was under palliative care and D/C'd 07-14-2015.  Called Gwyndolyn Saxon to notify him.  Thanked me for everything.Asked if he is Gwyndolyn Saxon.  "That'rs  My brother."

## 2015-08-04 NOTE — Telephone Encounter (Signed)
Fine for hospice referral. Would appreciate them expediting the first visit.  I can be attending if needed (I saw her once in hospital). Thank you

## 2015-08-04 NOTE — Telephone Encounter (Signed)
Call received from son Elgie Collard who's called Hospice and was given Dr. Mariana Kaufman number to call for Hospice referral.   "I saw her last night and she is lying there lifeless.  She's dehydrated, nauseatee won't eat or drink for the past month and looks like skin and bones waisting away.  Not there menatally  Was released from [place] a month or more back.Marland Kitchen  Her boyfriend lives with her and is willing but not able to care for her.  So weak the only way she can get out of the house is by ambulance and something needs to be done.  My return number is [phone]."  Seen in hospital by Dr. Marko Plume.  Aware Dr. Aron Baba to return tomorrow.  States she doesn't want to go to hospital.

## 2015-09-05 DEATH — deceased

## 2015-09-29 ENCOUNTER — Telehealth: Payer: Self-pay

## 2015-10-04 NOTE — Telephone Encounter (Signed)
error 

## 2016-06-12 IMAGING — PT NM PET TUM IMG INITIAL (PI) SKULL BASE T - THIGH
7 series · 25 of 25 positions shown · non-contrast
Comparison: AP CT on 05/18/2015 and chest CT on 04/20/2015

CLINICAL DATA: Initial treatment strategy for vulvar squamous cell
carcinoma.

EXAM:
NUCLEAR MEDICINE PET SKULL BASE TO THIGH
TECHNIQUE: 6.4 mCi F-18 FDG was injected intravenously. Full-ring PET imaging
was performed from the skull base to thigh after the radiotracer. CT
data was obtained and used for attenuation correction and anatomic
localization.
FASTING BLOOD GLUCOSE:  Value: 81 mg/dl

[Series 3: pet sk_thigh ac · axial · 5.0mm · 4.07mm/px · z∈[-827,-19]mm · 6 of 203 slices shown]
[im 1/203]
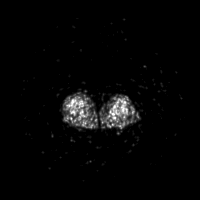
[im 41/203]
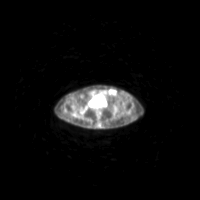
[im 81/203]
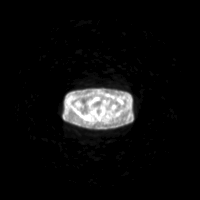
[im 122/203]
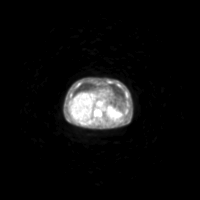
[im 162/203]
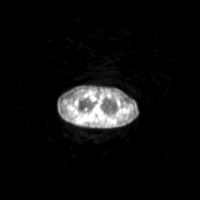
[im 203/203]
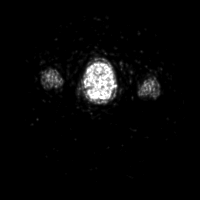

[Series 4: ct sk_thigh 5.0 b31f · axial · 5.0mm · 0.98mm/px · z∈[-827,-19]mm · 5 of 203 slices shown]
[im 1/203]
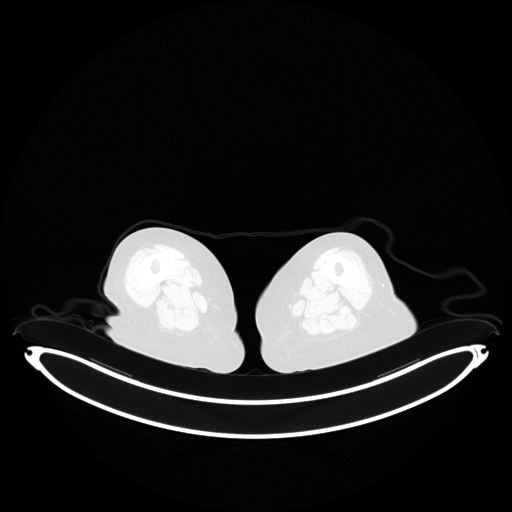
[im 51/203]
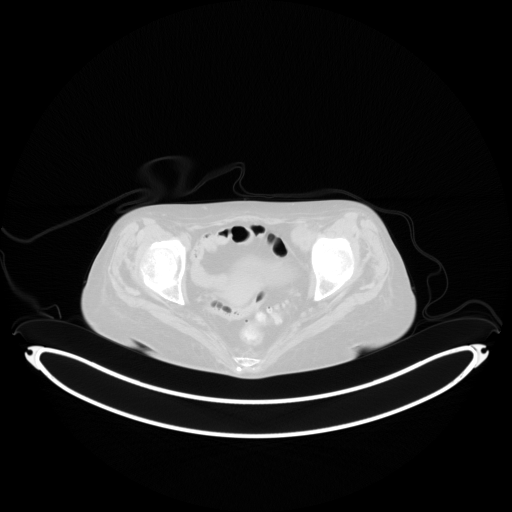
[im 102/203]
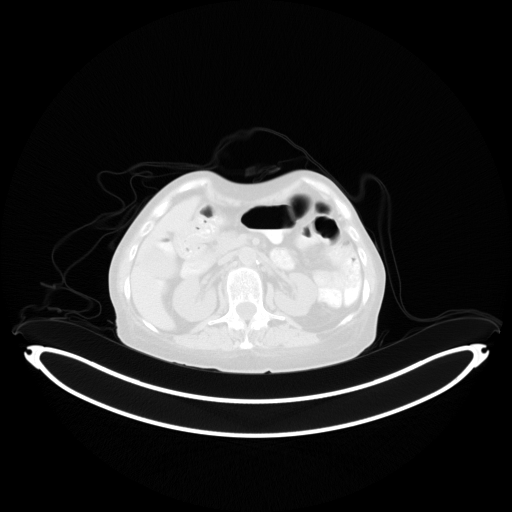
[im 152/203]
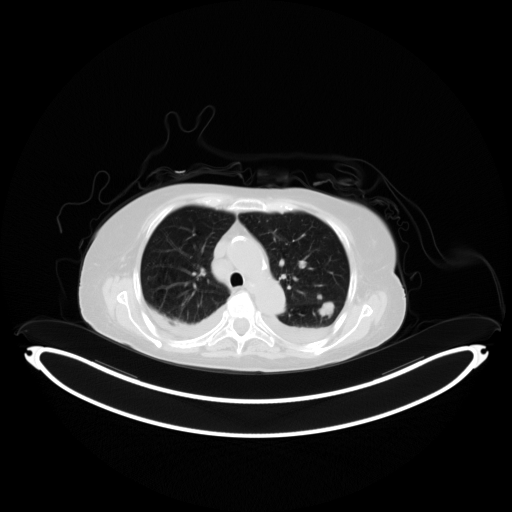
[im 203/203  brain]
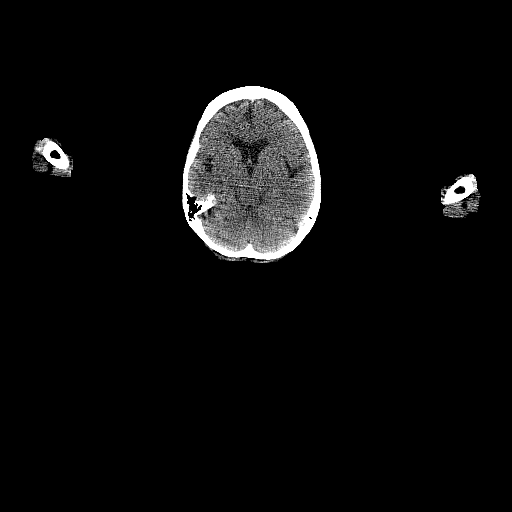

[Series 7: pet sk_thigh nac · axial · 5.0mm · 4.07mm/px · z∈[-827,-19]mm · 5 of 203 slices shown]
[im 1/203]
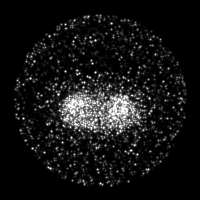
[im 51/203]
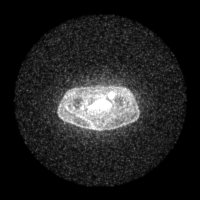
[im 102/203]
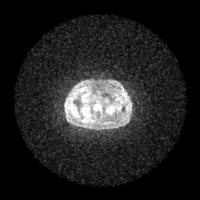
[im 152/203]
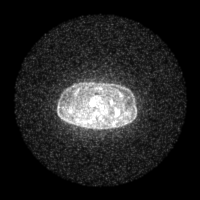
[im 203/203]
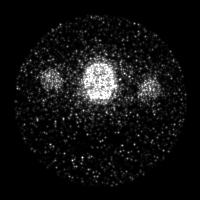

[Series 8: ct sk_thigh 5.0 b70f (id)_bone · axial · 5.0mm · 0.53mm/px · 1 of 55 slices shown]
[im 1/55  bone]
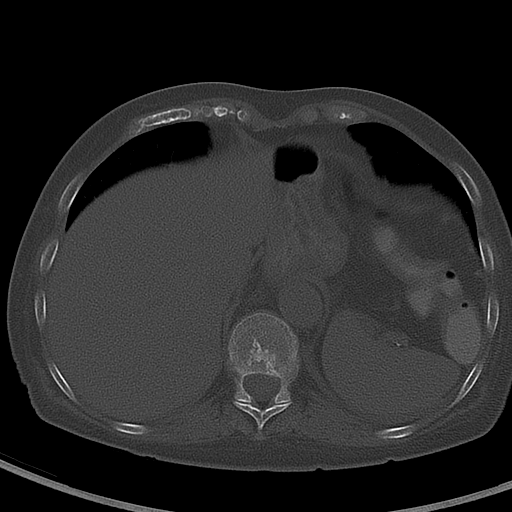

[Series 604: mip collection<mip range> · coronal · 1.68mm/px · 1 of 32 slices shown]
[im 1/32]
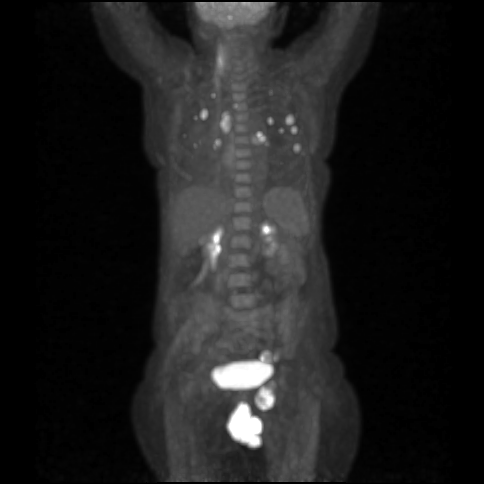

[Series 605: range-ct sk_thigh 5.0 (id)<alpha range> · 2 of 71 slices shown (1 of 2)]
[im 1/71]
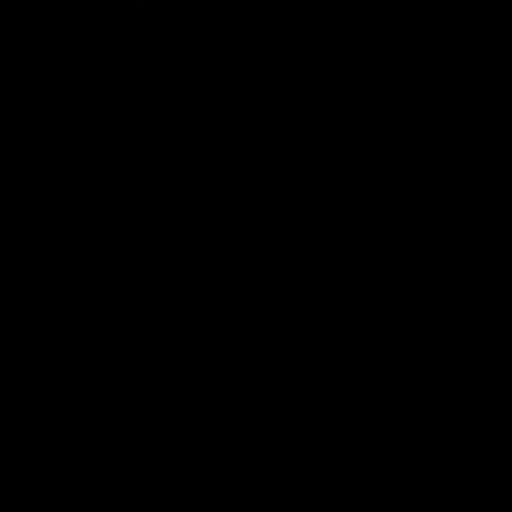
[im 71/71]
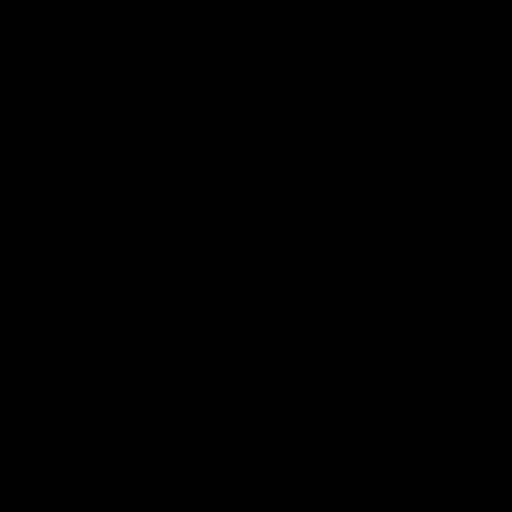

[Series 606: range-ct sk_thigh 5.0 (id)<alpha range> · 5 of 192 slices shown (2 of 2)]
[im 1/192]
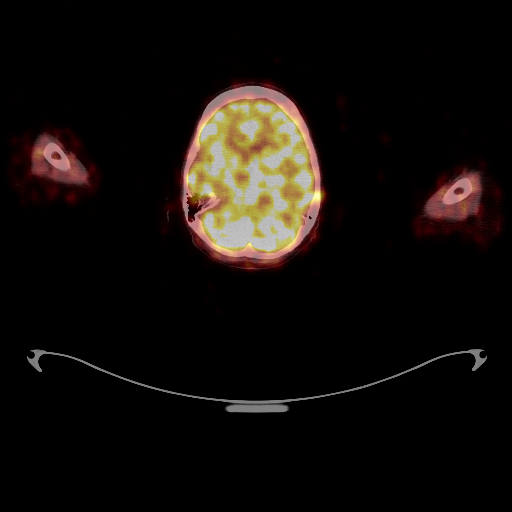
[im 48/192]
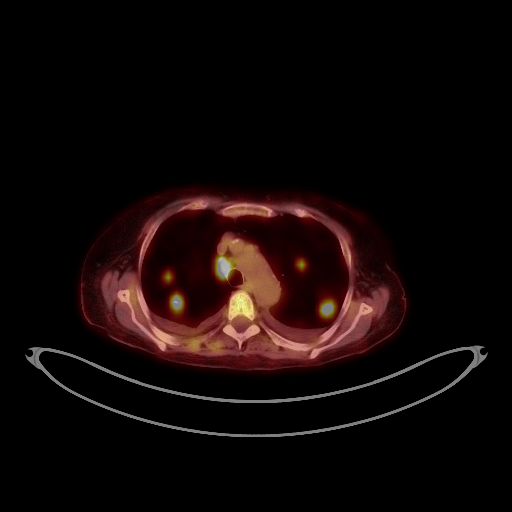
[im 96/192]
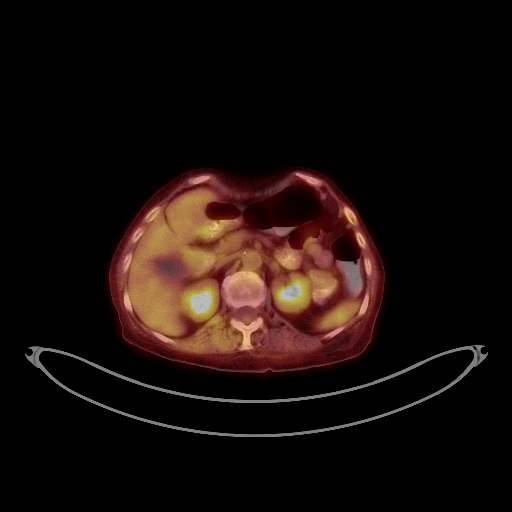
[im 144/192]
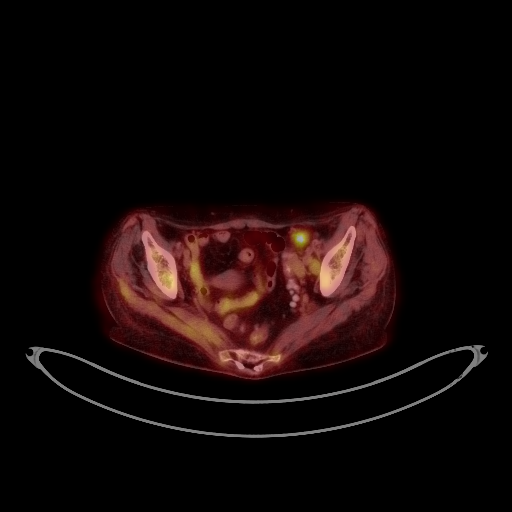
[im 192/192]
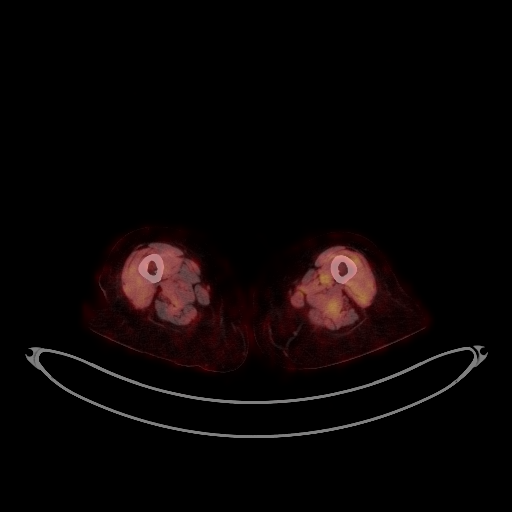

[25 of 25 positions shown; findings below may reference images not displayed]

FINDINGS: NECK

No hypermetabolic lymph nodes in the neck.

CHEST

Hypermetabolic lymphadenopathy is seen in the mediastinum and
bilateral hilar regions. Index mediastinal lymph node and right
paratracheal region measures 1.6 cm on image 53/series 4 compared to
7 mm previously. This has a maximum SUV of 13.6.

Hypermetabolic pulmonary nodules are seen bilaterally which are
increased in size and number compared to previous study. These are
consistent with pulmonary metastases. Index nodule in the superior
segment of the left lower lobe measures 16 mm on image 21 of series
8 compared to 9 mm previously, and has an SUV max of 11.5.

Small bilateral pleural effusions are again seen without associated
hypermetabolic activity. Small hiatal hernia again noted.

ABDOMEN/PELVIS

No abnormal hypermetabolic activity within the liver, pancreas,
adrenal glands, or spleen. No hypermetabolic lymph nodes in the
abdomen.

A large lobulated soft tissue mass is seen involving the vulva which
was incompletely visualized on previous CT. This measures
approximately 5.5 x 9.2 cm on image 178 of series 4, and has an SUV
max of 49.9. This is consistent with primary volar carcinoma.

Hypermetabolic lymphadenopathy is seen in the left external iliac
chain, with largest lymph node measuring 2.2 cm on image 151 of
series 4, with SUV max of 22.0. Hypermetabolic left inguinal
lymphadenopathy is also seen measuring 2.8 cm on image 160 of series
4, which has SUV max of 31.9. This lymphadenopathy shows no
significant change in size compared to most recent CT on 05/18/2015.

SKELETON

No focal hypermetabolic activity to suggest skeletal metastasis.
IMPRESSION: Large hypermetabolic vulvar soft tissue mass, consistent with
primary vulvar carcinoma.

Hypermetabolic pelvic lymphadenopathy in left external iliac chain
and left inguinal region, consistent with metastatic disease.

Hypermetabolic bilateral pulmonary metastases, increased compared to
recent chest CT. Small bilateral pleural effusions also noted.

Increased hypermetabolic mediastinal and bilateral hilar
lymphadenopathy, consistent with metastatic disease.
